# Patient Record
Sex: Female | Born: 1960 | Race: Black or African American | Hispanic: No | Smoking: Former smoker
Health system: Southern US, Community
[De-identification: ages and names within clinical notes are randomized; demographics above are authoritative.]

## PROBLEM LIST (undated history)

## (undated) DIAGNOSIS — N319 Neuromuscular dysfunction of bladder, unspecified: Secondary | ICD-10-CM

## (undated) DIAGNOSIS — R532 Functional quadriplegia: Secondary | ICD-10-CM

## (undated) DIAGNOSIS — F32A Depression, unspecified: Secondary | ICD-10-CM

## (undated) DIAGNOSIS — G35 Multiple sclerosis: Secondary | ICD-10-CM

## (undated) DIAGNOSIS — F329 Major depressive disorder, single episode, unspecified: Secondary | ICD-10-CM

## (undated) DIAGNOSIS — I2699 Other pulmonary embolism without acute cor pulmonale: Secondary | ICD-10-CM

## (undated) DIAGNOSIS — I82409 Acute embolism and thrombosis of unspecified deep veins of unspecified lower extremity: Secondary | ICD-10-CM

## (undated) HISTORY — PX: LAPAROSCOPIC TUBAL LIGATION: SUR803

## (undated) HISTORY — PX: TONSILLECTOMY: SUR1361

## (undated) SURGERY — Surgical Case
Anesthesia: *Unknown

---

## 2002-05-08 ENCOUNTER — Ambulatory Visit (HOSPITAL_COMMUNITY): Admission: RE | Admit: 2002-05-08 | Discharge: 2002-05-08 | Payer: Self-pay | Admitting: Internal Medicine

## 2002-11-06 ENCOUNTER — Encounter: Payer: Self-pay | Admitting: Emergency Medicine

## 2002-11-07 ENCOUNTER — Encounter: Payer: Self-pay | Admitting: Emergency Medicine

## 2002-11-07 ENCOUNTER — Inpatient Hospital Stay (HOSPITAL_COMMUNITY): Admission: EM | Admit: 2002-11-07 | Discharge: 2002-11-13 | Payer: Self-pay | Admitting: Emergency Medicine

## 2002-11-08 ENCOUNTER — Encounter: Payer: Self-pay | Admitting: General Surgery

## 2002-11-10 ENCOUNTER — Encounter: Payer: Self-pay | Admitting: General Surgery

## 2003-01-30 ENCOUNTER — Emergency Department (HOSPITAL_COMMUNITY): Admission: EM | Admit: 2003-01-30 | Discharge: 2003-01-31 | Payer: Self-pay | Admitting: Emergency Medicine

## 2003-01-31 ENCOUNTER — Encounter: Payer: Self-pay | Admitting: Emergency Medicine

## 2003-03-19 ENCOUNTER — Encounter: Payer: Self-pay | Admitting: Orthopedic Surgery

## 2003-03-20 ENCOUNTER — Ambulatory Visit (HOSPITAL_COMMUNITY): Admission: RE | Admit: 2003-03-20 | Discharge: 2003-03-20 | Payer: Self-pay | Admitting: Orthopedic Surgery

## 2003-07-09 ENCOUNTER — Ambulatory Visit (HOSPITAL_COMMUNITY): Admission: RE | Admit: 2003-07-09 | Discharge: 2003-07-09 | Payer: Self-pay | Admitting: Internal Medicine

## 2003-08-12 ENCOUNTER — Encounter: Admission: RE | Admit: 2003-08-12 | Discharge: 2003-08-12 | Payer: Self-pay | Admitting: Internal Medicine

## 2004-05-08 ENCOUNTER — Inpatient Hospital Stay (HOSPITAL_COMMUNITY): Admission: EM | Admit: 2004-05-08 | Discharge: 2004-05-13 | Payer: Self-pay | Admitting: Emergency Medicine

## 2004-09-01 ENCOUNTER — Encounter: Admission: RE | Admit: 2004-09-01 | Discharge: 2004-09-01 | Payer: Self-pay | Admitting: Internal Medicine

## 2005-09-05 ENCOUNTER — Encounter: Admission: RE | Admit: 2005-09-05 | Discharge: 2005-09-05 | Payer: Self-pay | Admitting: Internal Medicine

## 2006-10-25 ENCOUNTER — Encounter: Admission: RE | Admit: 2006-10-25 | Discharge: 2006-10-25 | Payer: Self-pay | Admitting: Internal Medicine

## 2007-04-03 ENCOUNTER — Encounter (HOSPITAL_BASED_OUTPATIENT_CLINIC_OR_DEPARTMENT_OTHER): Admission: RE | Admit: 2007-04-03 | Discharge: 2007-06-05 | Payer: Self-pay | Admitting: Surgery

## 2007-05-01 ENCOUNTER — Encounter: Admission: RE | Admit: 2007-05-01 | Discharge: 2007-05-01 | Payer: Self-pay | Admitting: Internal Medicine

## 2007-06-05 ENCOUNTER — Encounter (HOSPITAL_BASED_OUTPATIENT_CLINIC_OR_DEPARTMENT_OTHER): Admission: RE | Admit: 2007-06-05 | Discharge: 2007-09-03 | Payer: Self-pay | Admitting: Surgery

## 2007-09-18 ENCOUNTER — Encounter (HOSPITAL_BASED_OUTPATIENT_CLINIC_OR_DEPARTMENT_OTHER): Admission: RE | Admit: 2007-09-18 | Discharge: 2007-12-17 | Payer: Self-pay | Admitting: Surgery

## 2007-12-11 ENCOUNTER — Emergency Department: Payer: Self-pay | Admitting: Emergency Medicine

## 2008-02-07 ENCOUNTER — Ambulatory Visit: Payer: Self-pay | Admitting: Obstetrics and Gynecology

## 2008-04-07 ENCOUNTER — Emergency Department: Payer: Self-pay | Admitting: Emergency Medicine

## 2008-06-26 ENCOUNTER — Emergency Department: Payer: Self-pay | Admitting: Emergency Medicine

## 2008-08-12 ENCOUNTER — Ambulatory Visit: Payer: Self-pay | Admitting: Neurology

## 2008-11-09 ENCOUNTER — Ambulatory Visit: Payer: Self-pay

## 2009-01-02 ENCOUNTER — Ambulatory Visit: Payer: Self-pay | Admitting: Geriatric Medicine

## 2009-02-03 ENCOUNTER — Ambulatory Visit: Payer: Self-pay | Admitting: Internal Medicine

## 2009-02-16 ENCOUNTER — Ambulatory Visit: Payer: Self-pay | Admitting: Geriatric Medicine

## 2009-02-16 ENCOUNTER — Inpatient Hospital Stay: Payer: Self-pay | Admitting: Internal Medicine

## 2009-03-05 ENCOUNTER — Ambulatory Visit: Payer: Self-pay | Admitting: Internal Medicine

## 2009-04-07 ENCOUNTER — Ambulatory Visit: Payer: Self-pay | Admitting: Internal Medicine

## 2009-04-26 ENCOUNTER — Emergency Department: Payer: Self-pay | Admitting: Unknown Physician Specialty

## 2009-05-13 ENCOUNTER — Ambulatory Visit: Payer: Self-pay | Admitting: Geriatric Medicine

## 2009-05-13 ENCOUNTER — Emergency Department: Payer: Self-pay | Admitting: Internal Medicine

## 2009-06-25 ENCOUNTER — Emergency Department: Payer: Self-pay | Admitting: Emergency Medicine

## 2009-07-15 ENCOUNTER — Ambulatory Visit: Payer: Self-pay | Admitting: Gastroenterology

## 2009-11-12 ENCOUNTER — Ambulatory Visit: Payer: Self-pay | Admitting: Geriatric Medicine

## 2009-12-31 ENCOUNTER — Emergency Department: Payer: Self-pay | Admitting: Emergency Medicine

## 2010-01-21 ENCOUNTER — Emergency Department: Payer: Self-pay | Admitting: Unknown Physician Specialty

## 2010-06-05 DIAGNOSIS — I82409 Acute embolism and thrombosis of unspecified deep veins of unspecified lower extremity: Secondary | ICD-10-CM

## 2010-06-05 DIAGNOSIS — I2699 Other pulmonary embolism without acute cor pulmonale: Secondary | ICD-10-CM

## 2010-06-05 HISTORY — DX: Other pulmonary embolism without acute cor pulmonale: I26.99

## 2010-06-05 HISTORY — DX: Acute embolism and thrombosis of unspecified deep veins of unspecified lower extremity: I82.409

## 2010-06-29 IMAGING — XA IR VASCULAR PROCEDURE
2 series · 3 of 3 positions shown · IV contrast (IODINE)
Comparison: none

[Series 1: ivc · 2 of 2 slices shown (1 of 2)]
[im 1/2]
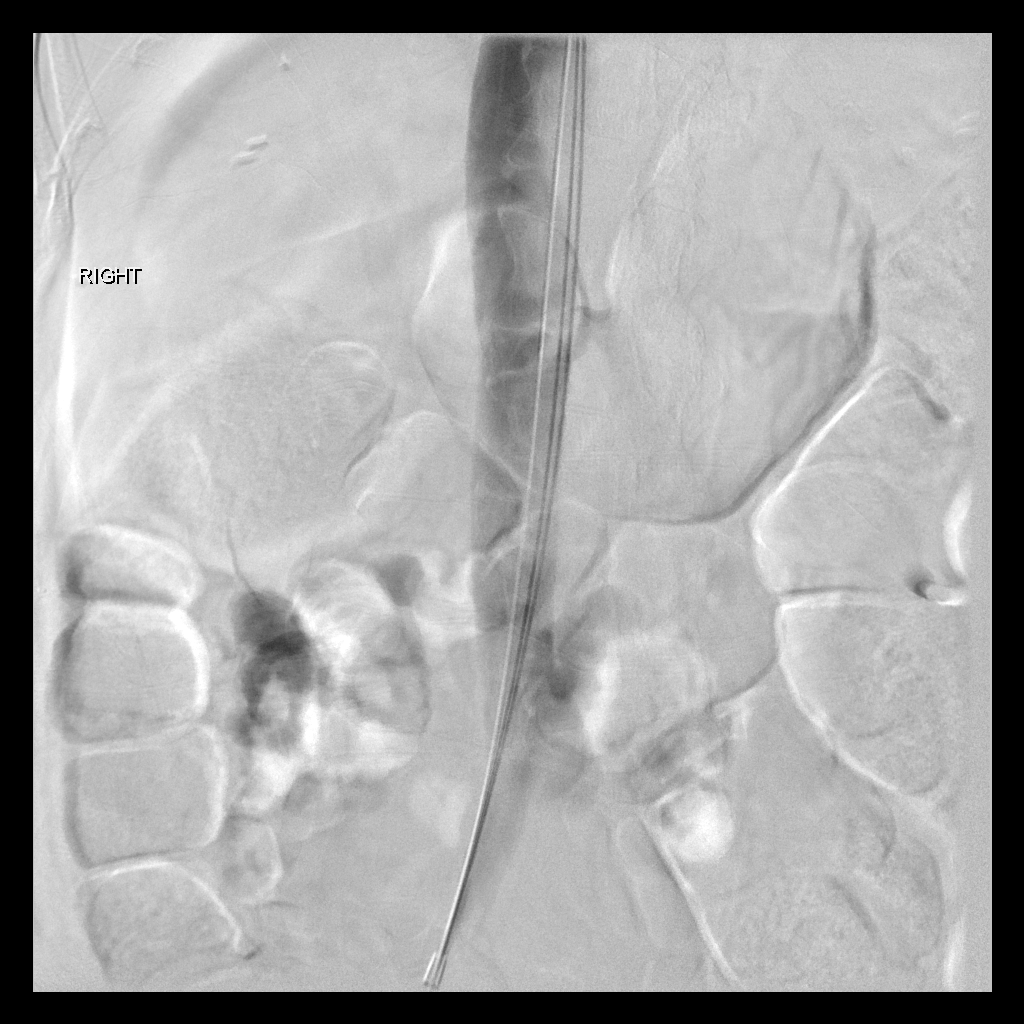
[im 2/2]
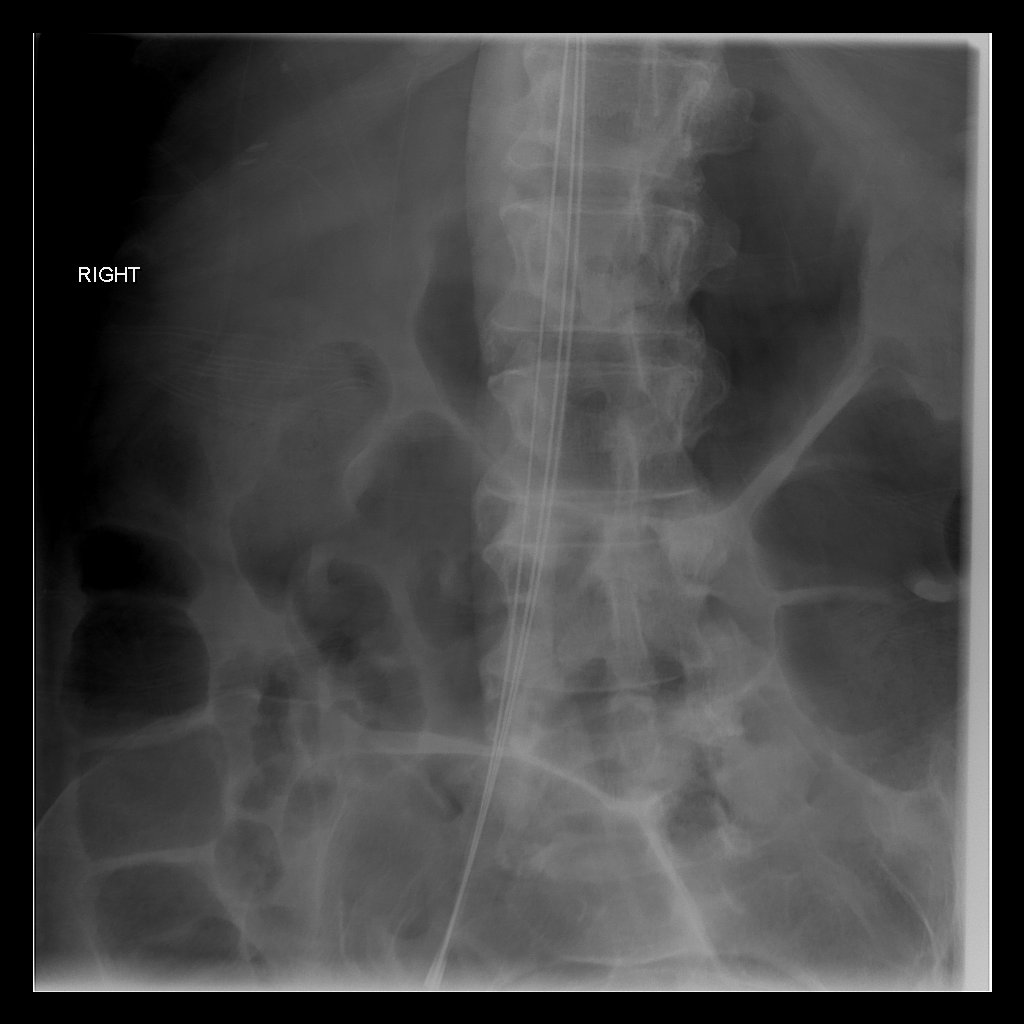

[Series 2: ivc · 1 of 1 slices shown (2 of 2)]
[im 1/1]
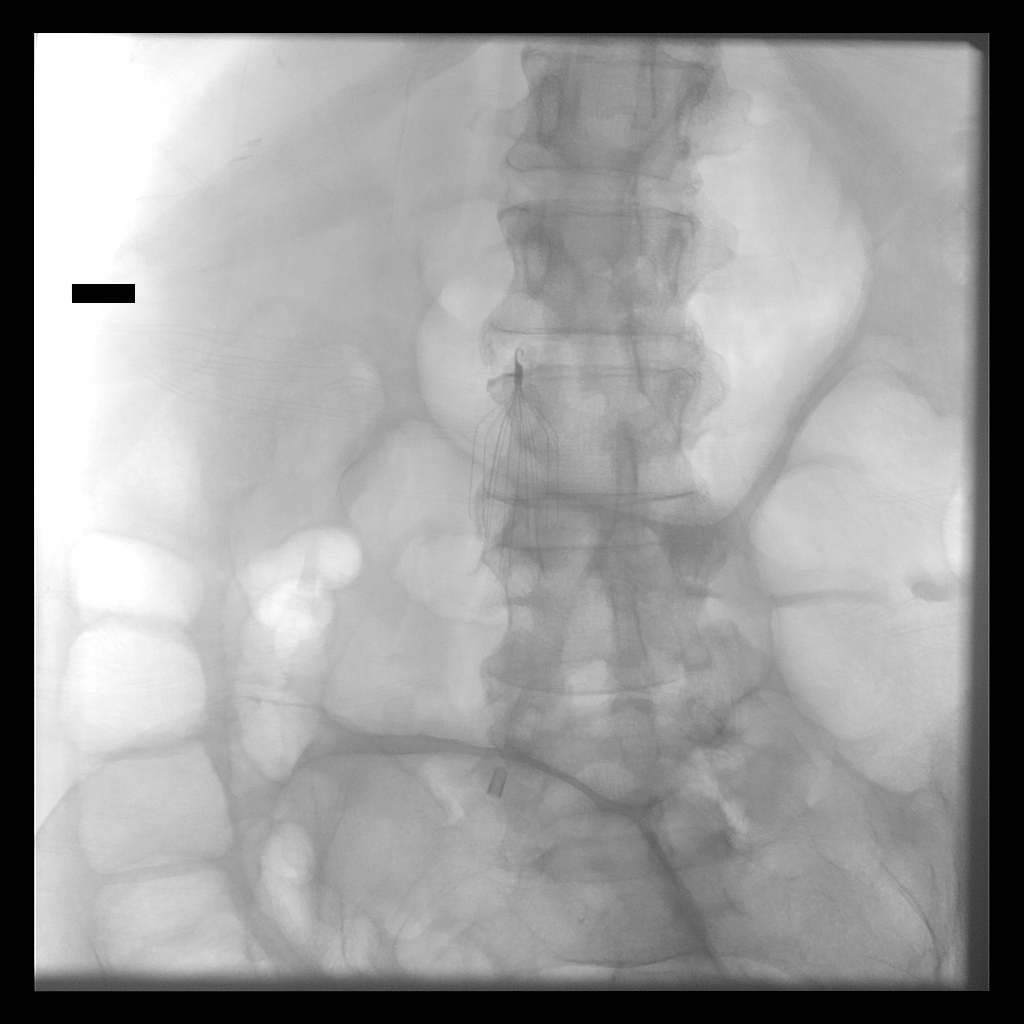

[3 of 3 positions shown; findings below may reference images not displayed]

IMAGES IMPORTED FROM THE [employer] WORKFLOW SYSTEM
NO DICTATION FOR STUDY

## 2010-07-02 IMAGING — US US RENAL KIDNEY
1 series · 17 of 25 positions shown · non-contrast
Comparison: none

REASON FOR EXAM: acute renal failure
COMMENTS:

[Series 1: us renal kidney · 17 of 37 slices shown]
[im 1/37]
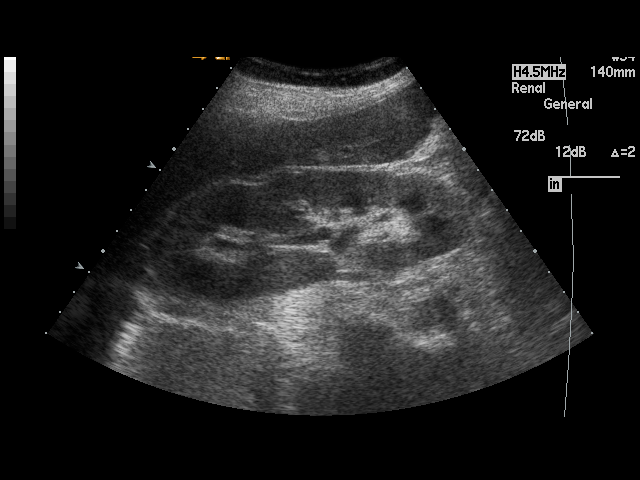
[im 4/37]
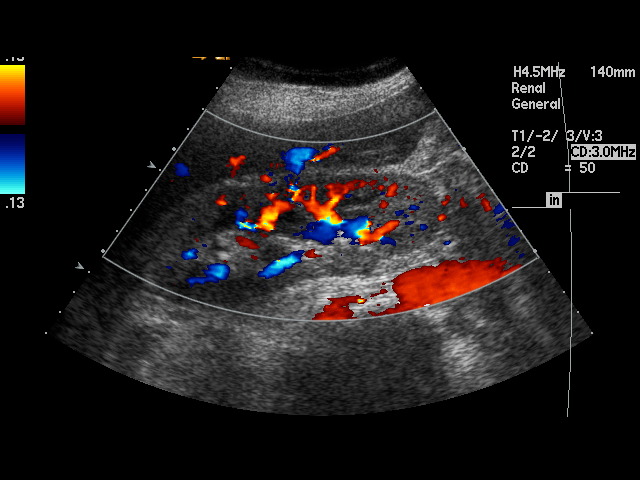
[im 5/37]
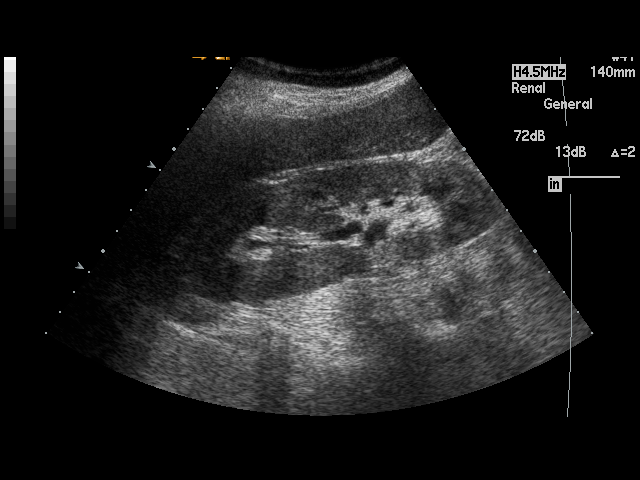
[im 8/37]
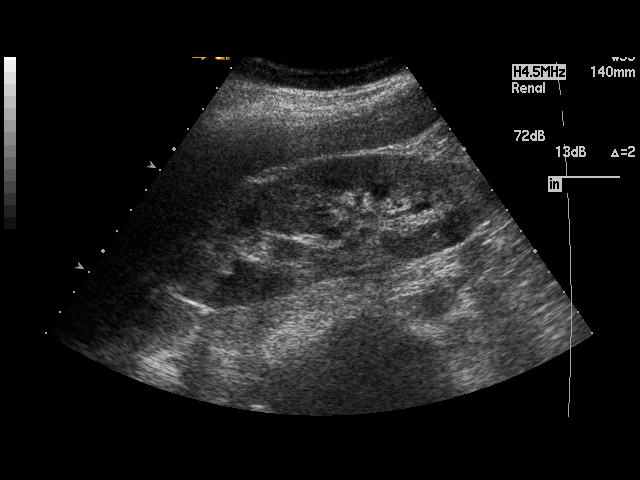
[im 10/37]
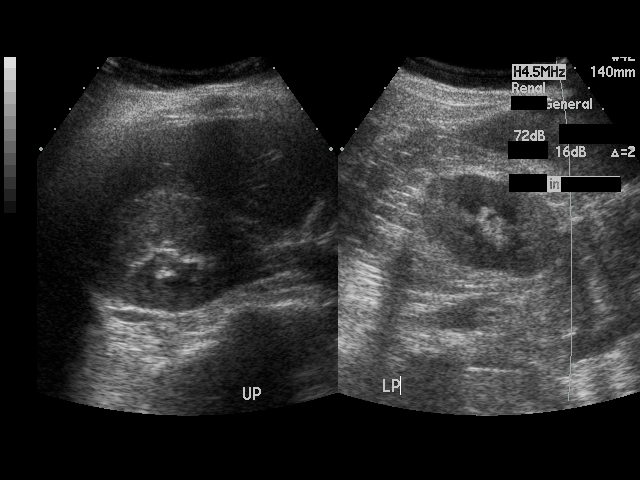
[im 13/37]
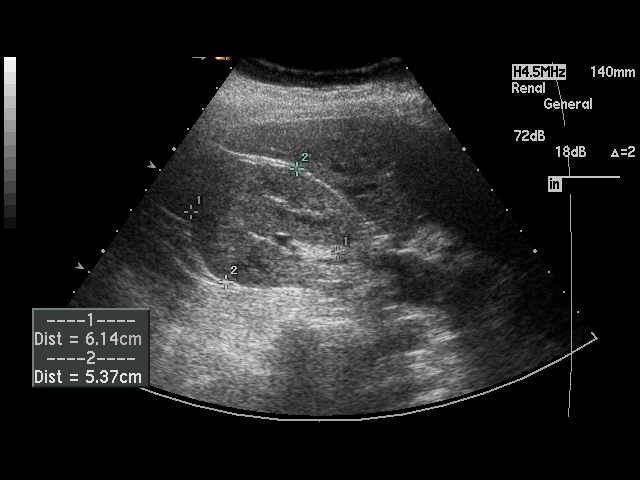
[im 14/37]
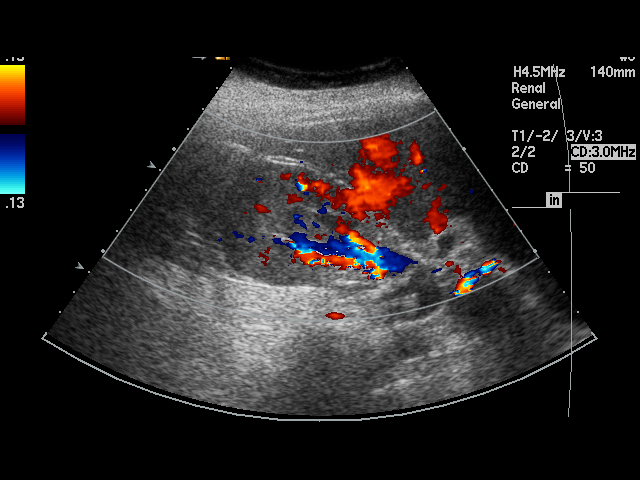
[im 17/37]
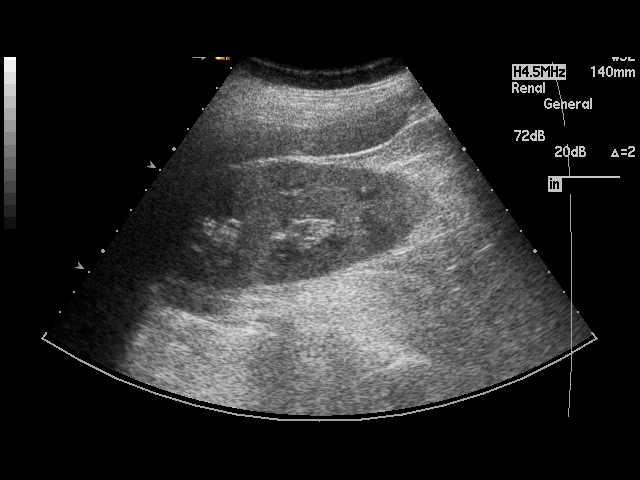
[im 19/37]
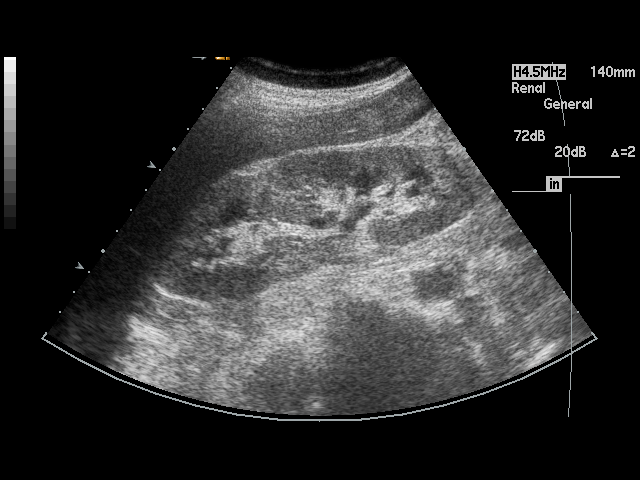
[im 20/37]
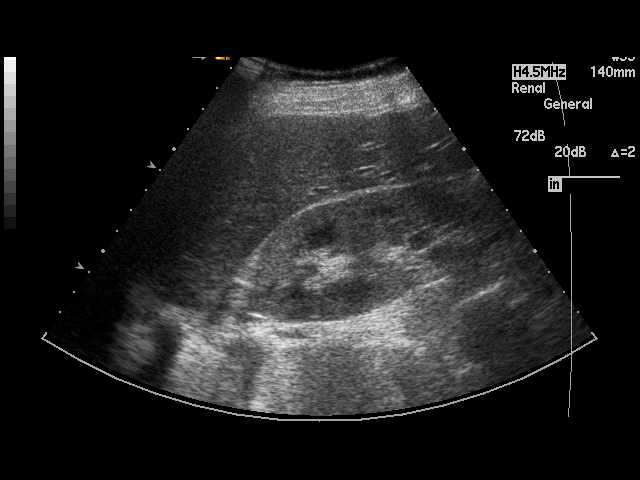
[im 23/37]
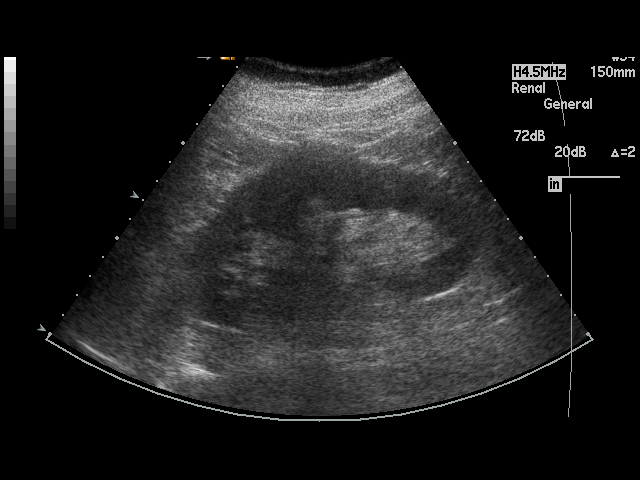
[im 25/37]
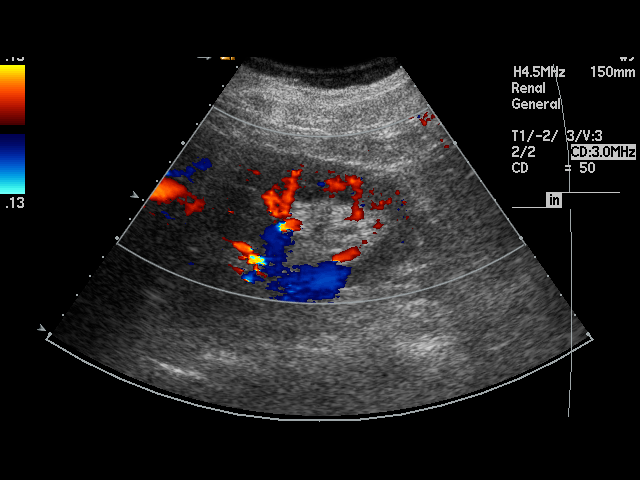
[im 28/37]
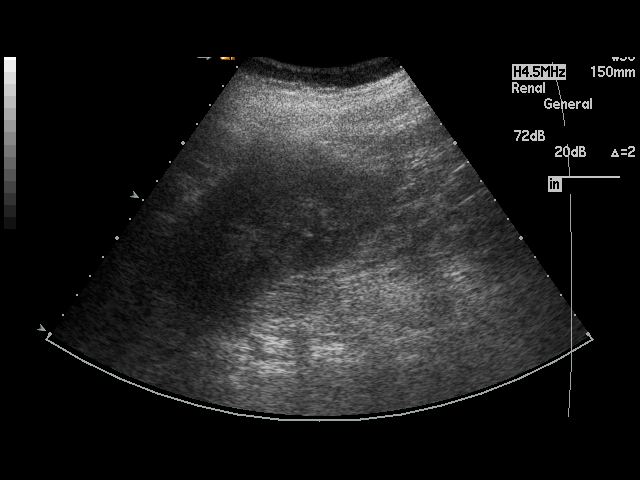
[im 29/37]
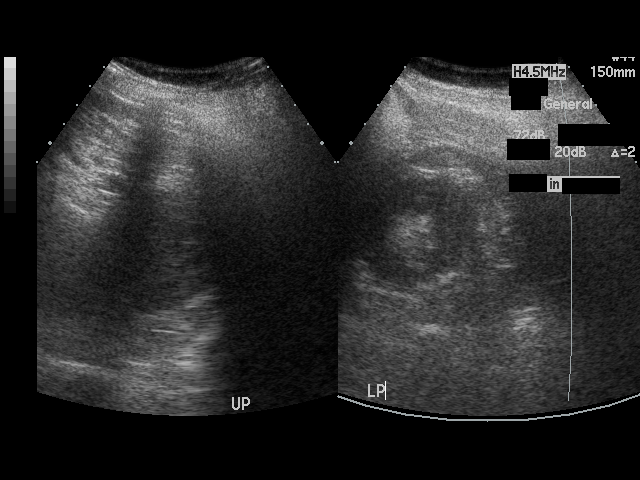
[im 32/37]
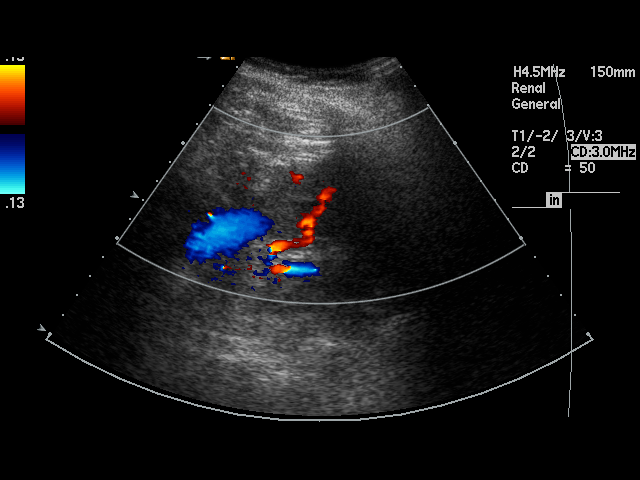
[im 34/37]
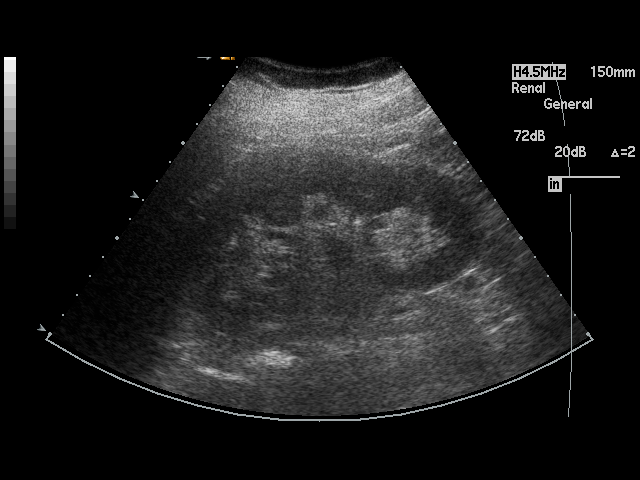
[im 37/37]
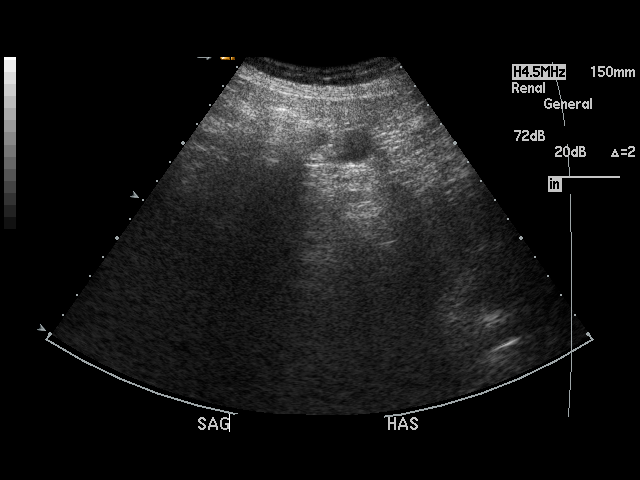

[17 of 25 positions shown; findings below may reference images not displayed]

PROCEDURE:     US  - US KIDNEY  - February 25, 2009  [DATE]

RESULT:     The right kidney measures 13.6 x 6.1 x 5.4 cm. The left kidney
measures 13 x 5.7 x 5.9 cm. The echotexture of the renal cortex on the right
is at least equal to that of the adjacent liver which likely reflects an
element of medical renal disease. I do not see evidence of obstruction. The
left kidney exhibits somewhat lower echotexture. There is no obstruction
either. The urinary bladder was decompressed with a Foley catheter.
IMPRESSION: 1. There is mildly increased echotexture the right kidney and likely of the
left kidney as well. It is difficult to directly compare the left kidney to
the liver due to positioning. The findings may reflect medical renal disease
however.
2. There is no evidence of obstruction.

## 2010-07-09 ENCOUNTER — Ambulatory Visit: Payer: Self-pay

## 2010-08-23 ENCOUNTER — Other Ambulatory Visit: Payer: Self-pay | Admitting: Geriatric Medicine

## 2010-09-20 ENCOUNTER — Ambulatory Visit: Payer: Self-pay | Admitting: Geriatric Medicine

## 2010-10-18 NOTE — Assessment & Plan Note (Signed)
[facility]   NAME:  [facility], [facility]                ACCOUNT NO.:  [id number]   MEDICAL RECORD NO.:  [id number]      DATE OF BIRTH:  1961-02-28   PHYSICIAN:  Theresia Majors. Tanda Rockers, M.D. VISIT DATE:  06/11/2007                                   OFFICE VISIT   SUBJECTIVE:  The patient is a 50 year old female who we are following  for a stage IV sacral ischial ulcer.  The patient has been on a wound  V.A.C. per [facility] protocol in the interim.  There has been no fever.  The  patient continues on Septra DS 1 p.o. b.i.d.  She is accompanied by her  mother and a case worker and is being transported by [employer], [employer].   OBJECTIVE:  VITAL SIGNS:  Blood pressure is 106/77, respirations 18,  pulse rate 93, temperature is 98.4  SKIN:  Inspection of the right ischium shows that the wound is clean.  There is healthy-appearing granulation.  The wound was sounded with a Q-  tip and discloses a depth of approximately 3 cm.  This is significantly  decreased from her previous exam.  There is no necrosis within the wound  and no debridement is needed   ASSESSMENT:  Clinical improvement with wound V.A.C. therapy.   PLAN:  We are reordering the continuation of both the Septra DS 1 p.o.  b.i.d.  We have requested that the facility repeat her wound culture, as  well as a urine culture.  We will re-evaluate her in 2 weeks.  We have  given the patient and her mother an opportunity to ask questions, and  they seem to understand the clinical impression of the recommendations  and indicate that they will be compliant.      Harold A. Tanda Rockers, M.D.  Electronically Signed     HAN/MEDQ  D:  06/12/2007  T:  06/12/2007  Job:  [id number]

## 2010-10-18 NOTE — Assessment & Plan Note (Signed)
[facility]   NAME:  [name], [name]                ACCOUNT NO.:  [id number]   MEDICAL RECORD NO.:  [id number]      DATE OF BIRTH:  1961/02/16   PHYSICIAN:  Theresia Majors. Tanda Rockers, M.D. VISIT DATE:  09/19/2007                                   OFFICE VISIT   SUBJECTIVE:  Br[facility] is a 50 year old female who we have followed  for a stage IV ischial ulcer involving the right lower extremity.  In  the interim, she has been treated with a wound VAC.  She remains a  resident at [facility].  There has been no interim change in her  medication.  The patient feels that the wound has improved.  There has  been less drainage, and there has been significantly decreased pain.  She is accompanied by her mother and an EMS transporter.   OBJECTIVE:  VITALS:  Blood pressure is 113/73, respirations 18, pulse  rate 71, temperature 98.5.   The patient was examined from the left lateral decubitus position.  The  right ischial ulcer is clean in appearance.  There is no palpable  fluctuance or expressible drainage.  Upon sounding with a Q-tip, this  wound extends to 2.5 cm and abuts onto the ischium.  There is a serous  drainage which has a mild malodor.  There is minimum undermining at 6  o'clock.  The wound #2 on the right buttock has completely resolved.   ASSESSMENT:  Clinical improvement with the negative pressure drainage  system.   PLAN:  We will continue the wound VAC per [facility] protocol at a negative 125  mmHg.  We will reevaluate the patient in 1 month.   We have given the patient and the mother an opportunity to ask  questions.  They expressed gratitude for having been seen in the clinic.      Harold A. Tanda Rockers, M.D.  Electronically Signed     HAN/MEDQ  D:  09/19/2007  T:  09/20/2007  Job:  [id number]

## 2010-10-18 NOTE — Assessment & Plan Note (Signed)
[facility]   NAME:  [facility], Brittany [facility]en                ACCOUNT NO.:  [id number]   MEDICAL RECORD NO.:  [id number]      DATE OF BIRTH:  06/04/1961   PHYSICIAN:  Jonelle Sports. Sevier, M.D.  VISIT DATE:  05/28/2007                                   OFFICE VISIT   HISTORY:  This 50 year old black female with severe multiple sclerosis  is followed for a deep ischial tuberosity decubitus on the right side.  She initially had been begun on vac therapy but this was discontinued  for reasons that are less than clear.  There has been from time to time  a bit of necrotic tissue within the wound, but this has been largely  debrided out over time.   The patient has had no new problems since her last visit here and the  wound has been managed simply with daily packing with mild Dakin's  soaked gauze.  She has also been begun on Septra DS in dose that is  uncertain to me, I presume at least b.i.d. to t.i.d.   There has been no fever, chills or systemic symptomatology.  The  patient's mother thinks the wound may be slightly larger.   EXAMINATION:  Blood pressure 101/72, pulse 89, respirations 16,  temperature 97.1.  the wound itself is largely clean and free of odor  with minimal necrotic tissue in the distal floor the wound.  This does  not extend extremely deeply.   The wound measurement is recorded here as 5.5 x 4.5 x 6.0 cm with 4 cm  undermining from approximately 6 o'clock to 1 o'clock.   IMPRESSION:  Stable stage IV decubitus right ischial area.   DISPOSITION:  It seems to me that this wound can most successfully be  managed by a vac therapy and accordingly we will attempt to reinstitute  that, the reasons for his discontinuation been less than clear in my  mind.   I have today debrided away a small amount of the remaining necrotic  tissue in the distal base of the wound.  This was well tolerated and the  small amount of bleeding induced was stabilized through pressure.   The  wound was then packed here with iodoform gauze and the patient is to  return to her receiving facility with instructions to contact [facility] for  reinstitution of vac therapy, for continuation for two additional weeks  of the Septra DS and for daily wound dressing with Dakin's gauze as  before until such time as the vac can be established.   Follow-up visit will be here in 2 weeks.           ______________________________  Jonelle Sports Cheryll Cockayne, M.D.     RES/MEDQ  D:  05/28/2007  T:  05/28/2007  Job:  [id number]

## 2010-10-18 NOTE — Assessment & Plan Note (Signed)
[facility]   NAME:  SAMON, DISHNER                ACCOUNT NO.:  [id number]   MEDICAL RECORD NO.:  [id number]      DATE OF BIRTH:  June 08, 1960   PHYSICIAN:  Theresia Majors. Tanda Rockers, M.D. VISIT DATE:  05/14/2007                                   OFFICE VISIT   SUBJECTIVE:  Ms. Chovan is a 50 year old lady who is seen in follow-up  for a stage IV ischial ulceration.  In the interim, she has been managed  at the nursing facility with sequential moist dressings utilizing  quarter percent Dakin's solution.  The patient reports that she feels  that her wound is better.  There is less drainage.  There has been no  interim fever.   OBJECTIVE:  Blood pressure is 113/75, respirations 14, pulse rate of 83,  temperature is 98.2.  Inspection of the wound shows that there is a  residual of necrotic tissue which was sharply excised.  The full-  thickness excisional debridement was accomplished without anesthesia and  tolerated very well by the patient.  Frankly necrotic tissue was excised  extending through the skin, subcutaneous area and fascia.  A deep wound  culture was then taken.  The wound was initially packed with plain gauze  for pressure to control minimum hemorrhage.  Thereafter, the gauze was  removed with no hemorrhage noted and a moist Kerlix soap and a 0.25%  Dakin's solution was applied.   ASSESSMENT:  Improved stage IV right ischial ulceration.   PLAN:  We will continue the Dakin's solution dressing.  The patient will  be seen in 2 weeks at the wound center.  Routine nursing care will be  continued at the nursing facility.  The patient was accompanied by her  mother who indicates that she has discussed her previously noted  grievances with the staff at the facility.      Harold A. Tanda Rockers, M.D.  Electronically Signed     HAN/MEDQ  D:  05/14/2007  T:  05/14/2007  Job:  [id number]   cc:   Ludwig Clarks, M.D.

## 2010-10-18 NOTE — Assessment & Plan Note (Signed)
[facility]   NAME:  [facility], MCCUMBERS                ACCOUNT NO.:  [id number]   MEDICAL RECORD NO.:  [id number]      DATE OF BIRTH:  14-Dec-1960   PHYSICIAN:  Theresia Majors. Tanda Rockers, M.D. VISIT DATE:  05/06/2007                                   OFFICE VISIT   SUBJECTIVE:  Ms. Jamison is a  50 year old female who is a resident of  [facility]. She has been treated for a stage III decubitus  involving the right ischium.  She is under the care of Dr. Ludwig Clarks.  The  wound care nurse at the facility requested that we see her for  progressive/worsening of the wound.  According to the patient, the wound  VAC was discontinued a week ago and there has been increasing malodor.  There has been no fever.   The patient is accompanied by her mother who he is dissatisfied with the  care that she is receiving.  Her mother is also complaining of the  absence of an attending nurse during this visit.   OBJECTIVE:  Blood pressure is 96/70, respirations 18, pulse rates 83,  temperature is 98.5.  The patient is accompanied by her mother and the  attendant from [place]. In the left lateral decubitus position,  the right ischium was inspected.  This wound has a halo of full-  thickness necrosis with ecchymosis and frank liquefaction.  An  anesthetic mixture was placed into the wound and thereafter an  excisional debridement was performed. Full necrosed skin, subcutaneous  tissue and adipose were excised.  There was healthy bleeding tissue  which was easily controlled with direct pressure.  The wound depths were  measured, cataloged and the wound was photographed.  The wound was  cultured.   ASSESSMENT:  This wound has progressed from a stage III to a stage IV  right ischial wound extending onto the periosteum of the ischium.   PLAN:  We have recommended that we discontinue the use of the wound VAC  and that we institute daily quarter percent Dakin's solution irrigations  and  packings.  We will reevaluate this patient in 1 week.  In the  interim, all standard efforts at decubitus prevention should be utilized  including attention to frequent turnings, the use of an air mattress  with an oscillating cycle.  Attention to nutrition and surveillance for  the possibility of a deeper infection is also recommended.   We have explained this recommendation to the patient and her mother in  terms that they seem to understand. We have encouraged the mother to  address her grievances with the facility director.  We will reevaluate  the patient in 1 week.      Harold A. Tanda Rockers, M.D.  Electronically Signed     HAN/MEDQ  D:  05/06/2007  T:  05/06/2007  Job:  [id number]   cc:   Henri Medal, MD

## 2010-10-18 NOTE — Assessment & Plan Note (Signed)
[facility]   NAME:  [facility], [facility]en                ACCOUNT NO.:  [id number]   MEDICAL RECORD NO.:  [id number]      DATE OF BIRTH:  01-Feb-1961   PHYSICIAN:  Theresia Majors. Tanda Rockers, M.D.      VISIT DATE:                                   OFFICE VISIT   SUBJECTIVE:  Brittany Madden is a 50 year old lady who we are following for  stage IV left ischial ulcer.  In the interim she has been treated at  [facility] with a wound vac.  There has been no interim fever.  The malodor has been limited.  An interim culture result has not been  forwarded.   OBJECTIVE:  Blood pressure is 96/67, respirations 18, pulse rate 82,  temperature 97. The patient is accompanied by the EMS attendants.  The  wound is clean with evidence of significant volume reduction, no  debridement was needed.  The periwound area is clean.  There is no  evidence of additional maceration or pressure ulceration.  Please refer  to the data entries for specifics.   ASSESSMENT:  Clinical improvement, positive response with negative  pressure dressing.   PLAN:  Will continue the wound vac. In the interim we will place the  patient in a moist moist dressing.  We will reevaluate the patient in 1  month.      Harold A. Tanda Rockers, M.D.  Electronically Signed     HAN/MEDQ  D:  06/27/2007  T:  06/27/2007  Job:  [id number]

## 2010-10-18 NOTE — Assessment & Plan Note (Signed)
[facility]   NAME:  [facility], FRETWELL                ACCOUNT NO.:  [id number]   MEDICAL RECORD NO.:  [id number]      DATE OF BIRTH:  [birth year]   PHYSICIAN:  Theresia Majors. Tanda Rockers, M.D. VISIT DATE:  04/04/2007                                   OFFICE VISIT   SUBJECTIVE:  Ms. [facility] is a 50 year old female referred by Dr. Ludwig Clarks  for evaluation of a pressure ulcer on the right buttock area.   IMPRESSION:  Stage 3 decubitus.   RECOMMENDATIONS:  Proceed with a wound vac per [facility] protocol with serial  exams at a q. month interval in the wound center.  The patient will  likely require interim debridements to effect complete closure.   SUBJECTIVE:  Ms. [facility] is a 50 year old lady who has been a resident  of the [facility] facility for the past several years.  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  In  addition, she has had a major cerebrovascular accident which has further  compromised her mobility.   The current wound has been present for approximately 2 months. It has  been associated with malodor and breakdown.  It has been treated with  local pressure reducing methods as well as topical agents.  There has  been no interim fever. She has had antibiotics delivered based on  cultures.  Most recently she completed a 5-day course of Keflex.   ALLERGIES:  She is allergic to ASPIRIN, NSAIDs and seafood.   CURRENT MEDICATION:  1. Claritin 10 mg daily.  2. Singulair 10 mg daily.  3. Neurontin 600 mg b.i.d.  4. Betaserone 0.3 mg subcutaneously every other day.  5. Mirapex 0.125 mg b.i.d.  6. Lopressor 25 mg b.i.d.  7. Zelnorm 6 mg b.i.d.  8. Xanax 150 mg daily.  9. Crestor 5 mg daily.  10.Vicodin 5/500 p.r.n.  11.Depakote 250 mg daily.  12.Cymbalta 60 mg daily.   PAST SURGICAL HISTORY:  Tubal ligation, tracheostomy, gastrostomy and  contracture releases.  She is disabled due to her multiple sclerosis.   FAMILY HISTORY:   Negative for multiple sclerosis. Her mother lives in  [place]. She has several siblings who are in good health.  The family  history is positive for hypertension, stroke and heart attack. It is  also positive for diabetes.   REVIEW OF SYSTEMS:  Remarkable for her invalid status.  She requires  complete nursing care.   PHYSICAL EXAM:  She is a near paraplegic female in no acute distress.  She has a muffled voice but there is no respiratory distress or  difficulty with handling secretions.  There is no voluntary movement of  her lower extremities.  There is gross motion of both upper extremities.  Facial muscles are weak but the patient is able to make a partial smile.  Blood pressure is 130/70, respirations 18, pulse rate 80, temperature is  98.1.  HEENT:  Clear.  NECK:  Supple.  Trachea is midline.  Thyroid is nonpalpable.  Breath sounds are shallow.  Heart sounds are distant.  The abdomen is protuberant with tympany but no peritoneal signs, active  bowel sounds.  The extremity exams are remarkable for relative anesthesia with released  contractures. There are no  ulcerations.  Inspection of the lumbar sacral  area discloses an ulceration at the inferior ischial area in the buttock  crease on the right. This wound extends 1.5 to 2 cm in depth.  There is  no excessive necrosis, there is no excessive malodor,  a debridement is  not needed. Neurologically, the patient has anesthesia throughout the  area of the wound.   DISCUSSION:  Ms. [facility] has a stage 3 ischial ulcer which should respond  nicely to negative pressure vacuum therapy. We will recommend that we  proceed with the [facility] appliance per protocol recommending 125 mm suction  continuously with changes every 2-3 times a week as determined by the  output.  The patient should be able to bathe between the changes of the  vac to improve her hygiene.  We have discussed this recommendation with  the patient in terms that she seems  to understand.  She expresses  gratitude for having been seen in the clinic.  We will reevaluate her in  1 month p.r.n.      Harold A. Tanda Rockers, M.D.  Electronically Signed     HAN/MEDQ  D:  04/04/2007  T:  04/05/2007  Job:  [id number]   cc:   Ralene Ok, M.D.

## 2010-10-18 NOTE — Assessment & Plan Note (Signed)
[facility]   NAME:  [facility], [facility]                ACCOUNT NO.:  [id number]   MEDICAL RECORD NO.:  [id number]      DATE OF BIRTH:  1961-01-08   PHYSICIAN:  Theresia Majors. Tanda Rockers, M.D. VISIT DATE:  08/22/2007                                   OFFICE VISIT   SUBJECTIVE:  Br[facility] is a 50 year old lady who we are following for a  right stage IV pressure ulcer on the ischium.  She continues to have a  wound vac applied.  She continues to be rotated from her dependent  position in the bed into a chair daily.  Her appetite is good.  There  has been no interim fever or excessive drainage.  She is accompanied by  her mother.   OBJECTIVE:  VITAL SIGNS:  Blood pressure is 104/67, respirations 16,  pulse rate 69, temperature 97.9.  Inspection of the right ischial area  shows that the external orifice is approximately 1 cm with a sinus  extending 8 cm.  The wound is clean.  There is no evidence of periwound  erythema, fluctuance or cellulitis.  There are areas of mild maceration  at the 6 o'clock position with a small area of epithelial evulsion.   ASSESSMENT:  Clinical improvement of stage IV pressure ulcer.   PLAN:  We will continue the wound vac.  We are recommending that a  sponge be placed into the sinus and that the suction be reduced to  negative 100 mmHg.  We will reevaluate the patient in 1 month.      Harold A. Tanda Rockers, M.D.  Electronically Signed     HAN/MEDQ  D:  08/22/2007  T:  08/22/2007  Job:  [id number]

## 2010-10-18 NOTE — Assessment & Plan Note (Signed)
[facility]   NAME:  [facility], [facility]                ACCOUNT NO.:  [id number]   MEDICAL RECORD NO.:  [id number]      DATE OF BIRTH:  05-01-1961   PHYSICIAN:  Theresia Majors. Tanda [facility]en, M.D.      VISIT DATE:                                   OFFICE VISIT   SUBJECTIVE:  Br[facility]any [facility] is a 50 year old female who we followed for  stage IV pressure ulcer.  She is currently a resident at [facility] under the care of Dr. Bernarda [facility].  She has been treated with a  wound VAC for the last month.  There has been no fever.  Appetite  remains good.  She continues to use an active air mattress bed.  She is  accompanied by her mother.  Vital signs are not indicated the patient  was examined from the left lateral decubitus position.  Wound #1 shows  dramatic contraction.  There is the new development of areas of  maceration and desquamation.  The wound itself has a 100% granulation  with advancing epithelium from the periphery.  There is no evidence of  abscess formation or tracking.  There was fresh blood on the pamper, and  the patient was examined more thoroughly by the nursing staff and a  determination that her blood was coming from her vaginal vault.   ASSESSMENT:  Improvement of stage IV pressure ulcer.   PLAN:  We will discontinue the wound VAC.  We have written orders for  daily antibacterial soap wash and thorough drying with routine perineal  care per the [facility].  We will re-evaluate the patient  in 1 month p.r.n.  We have given the patient and the mother an  opportunity to ask questions.  They seem to understand the clinical  impression, recommendation, and indicate that they would be compliant.      [facility], M.D.  Electronically Signed     HAN/MEDQ  D:  10/24/2007  T:  10/25/2007  Job:  [id number]

## 2010-10-18 NOTE — Assessment & Plan Note (Signed)
[facility]   NAME:  [facility]                ACCOUNT NO.:  [id number]   MEDICAL RECORD NO.:  [id number]      DATE OF BIRTH:  05-08-1961   PHYSICIAN:  Theresia Majors. Tanda Rockers, M.D. VISIT DATE:  07/25/2007                                   OFFICE VISIT   SUBJECTIVE:  Br[facility]any [facility] is a 50 year old female whom we have followed  for a stage IV pressure ulcer involving the right ischium.  In the  interim, she has been treated with a wound vac.  She has also been on an  active air mattress to avoid extreme pressure.  She is seen in follow-  up.  There has been no interim fever but there has been particularly  strong malodor.  She is accompanied by EMS and her mother.   Blood pressure is 95/68, respirations 16, pulse rate 79, temperature is  97.6.  Inspection of the right ischial area including the ulcer shows that  there is a granulating flat area with a central area of ulceration that  extends 8 cm down to periosteum.  This is a sinus tract that easily  accommodates the Q-tip, but is not associated with a larger cavity based  upon the sounding.  There is no periwound induration or fluctuance.  The  malodor is suggestive of mixed anaerobic flora.   ASSESSMENT:  Clinically improved pressure ulcer, responding to the wound  vac.   PLAN:  We are recommending that the nursing home proceed with a culture  of the wound and institute specific antibiotics based on those culture  reports.  We will continue the wound vac.  We will reevaluate the  patient in 1 month.  If there is no significant decrease in the sinus,  this patient would be advised to have an orthopedic consultation for  possible operative curettage and debridement of all nonviable tissue,  which may be a deep to the access provided by the wound vac.   We have explained the clinical impression and these recommendations to  the patient and her mother in terms that they seem to understand.  They  express gratitude  for having been seen in the clinic and indicate that  they will be compliant. a copy of this note to [place]  attention Dr. more area.      Harold A. Tanda Rockers, M.D.  Electronically Signed     HAN/MEDQ  D:  07/25/2007  T:  07/25/2007  Job:  [id number]   cc:   Ralene Ok, M.D.

## 2010-10-18 NOTE — Assessment & Plan Note (Signed)
[facility]   NAME:  Brittany Madden, Brittany Madden                ACCOUNT NO.:  [id number]   MEDICAL RECORD NO.:  [id number]      DATE OF BIRTH:  [birth year]   PHYSICIAN:  Theresia Majors. Tanda Rockers, M.D. VISIT DATE:  05/20/2007                                   OFFICE VISIT   This is a phone conversation with Dr. Ludwig Clarks.  We discussed this  morning [name]' culture reports showing a Providencia stuartii.  The  sensitivity suggests that this organism is sensitive to sulfur.  Dr.  Ludwig Clarks and I discussed the options of an intravenous antibiotic versus  an oral sulfur drug.  We concurred that the patient would be started on  Septra DS if she is in fact not allergic to sulfur.  We will continue to  follow her clinically in the wound center.  Review of the intake history  and physical suggests that THE PATIENT IS ALLERGIC ONLY TO ASPIRIN,  NSAIDS AND SEAFOOD.  The order for initiation of antibiotics has been  forwarded to [facility], i.e., senior care.  The patient will keep  the [previously arranged appointment at the wound center.      Harold A. Tanda Rockers, M.D.  Electronically Signed     HAN/MEDQ  D:  05/20/2007  T:  05/20/2007  Job:  [id number]

## 2010-10-21 NOTE — H&P (Signed)
Brittany, Madden                            ACCOUNT NO.:  [id number]   MEDICAL RECORD NO.:  [id number]                   PATIENT TYPE:  INP   LOCATION:  5735                                 FACILITY:  [facility]   PHYSICIAN:  Adolph Pollack, M.D.            DATE OF BIRTH:  Jun 19, 1960   DATE OF ADMISSION:  11/06/2002  DATE OF DISCHARGE:                                HISTORY & PHYSICAL   CHIEF COMPLAINT:  Right-sided abdominal pain.   HISTORY OF PRESENT ILLNESS:  The patient is a 50 year old female  who on  Monday had the onset of nausea and vomiting. Following  the nausea and  vomiting she then developed a sharp right-sided abdominal pain. The people  at [place] it may be related to constipation and gave her a  Dulcolax suppository and she had a bowel movement but still did not have  relief. She continued to have some of the pain on Tuesday and Wednesday and  a heating pad was applied. She had been voiding normally. The pain again is  fairly constant and is sharp and is in the right flank, right mid abdomen  and right lower quadrant abdominal  area.   She eventually presented to the emergency department because of the  persistence of the pain. She has had a good appetite since the episode of  nausea and vomiting and has been eating. She denies any fevers or chills, no  diarrhea. She has had some impaction before but denies severe pain like  this. She has been in the emergency department since approximately 6:30 on  November 06, 2002, and I was subsequently called and asked to see her at  approximately 4 a.m. because of the undefined abdominal pain. A CT scan was  performed in the emergency department as well as some plain films. This was  consistent with a large amount of stool in the colon. There were no acute  inflammatory changes. The appendix was noted to be elongated with a 5-mm  fecalith in the midportion, but there is no appendiceal thickening or  inflammatory changes  suggestive of acute appendicitis. There is a small left  ovarian type cyst and a tiny amount of free fluid in the cul-de-sac.   PAST MEDICAL HISTORY:  1. Multiple sclerosis, treated by Dr. Oralia Manis.  2. 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  3. Acute renal failure related to #2. All of this was treated at [facility] and occurred on November 16, 2002.  4. History of cocaine overdose.  5. Chronic renal insufficiency   PAST SURGICAL HISTORY:  1. Lower extremity surgery.  2. Bilateral tubal ligation.  3. Tracheostomy.  4. Gastrostomy.   ALLERGIES:  ASPIRIN, NSAIDS AND SEAFOOD.   MEDICATIONS:  She takes Betaseron and other antihypertensive. Other  medications include:  1.  Lovenox 40 mg subcu daily.  2. Betaseron 250 micrograms subcu every other day.  3. Depakote 250 mg daily.  4. Lexapro 20 mg daily.  5. Mirapex 25 mg b.i.d.  6. Lopressor 75  mg b.i.d.  7. Senokot 1 q. h.s.  8. Ambien 10 mg q. h.s.  9. Colace 100 mg q. h.s.  10.      Zantac 150 mg daily.  11.      Seroquel 25 mg q. h.s.  12.      Vicodin p.r.n. pain.   SOCIAL HISTORY:  She is a former smoker. She denies alcohol use.   FAMILY HISTORY:  Positive for sickle cell disease and hypertension.   REVIEW OF SYSTEMS:  CONSTITUTIONAL:  Other than abdominal pain, she has been  in her normal state of health. CARDIOVASCULAR:  As per HPI. No further heart  problems reported since the cocaine induced myocardial infarction.  PULMONARY:  Denies pneumonia, asthma, COPD or TB. GI:  Denies peptic ulcer  disease, hepatitis, diverticulitis. GU:  No known kidney stones. She has had  a urinary tract infection in the past. ENDOCRINE: No thyroid disease or  diabetes. NEUROLOGIC:  No reported seizures. HEMATOLOGIC:  No bleeding  disorders, transfusions or blood clots. MUSCULOSKELETAL:  She is now a non  ambulator and goes to physical  therapy at [facility].   PHYSICAL EXAMINATION:  GENERAL:  A well developed, well nourished female. In  no acute distress and actually is very pleasant and cooperative.  VITAL SIGNS:  Her last temperature was 99.1, blood pressure 110/70, pulse  74, oxygen saturations 98% on room air.  HEENT:  Extraocular movements intact with no icterus noted.  NECK:  Supple without masses. No obvious thyroid enlargement. There is a  lower midline neck scar.  RESPIRATORY:  Breath sounds equal and clear. Respirations are unlabored.  CARDIOVASCULAR:  Heart demonstrates a regular rate and rhythm.  ABDOMEN:  Slightly firm, protuberant. Active bowel sounds are noted.  She  has some mild left lower quadrant tenderness to palpation. The right side of  her abdominal wall and flank are hyperesthetic to even the touch with the  sheet in a dermatomal type pattern, although no rash is noted. She does have  abdominal  tenderness to palpation and percussion in the right lower  quadrant and right flank areas. There are no obvious masses noted.  PELVIC:  There is very mild right adnexal tenderness but no obvious mass. No  left adnexal tenderness.  RECTAL:  She has normal tone. There some mucoid type stool in the rectal  vault and it was sent for Hemoccult testing.  No palpable mass is noted. No  significant tenderness on examination.  EXTREMITIES:  Decreased range of motion in the lower extremities. Good range  of motion in the upper extremities. No cyanosis or edema noted.  NEUROLOGIC:  Slightly slurred speech but is easily understood. Decreased  motor strength in the lower extremities.   LABORATORY DATA:  White cell count has been done twice, first at 8:45 p.m.  when it was 7200, no leftward shift and hemoglobin 20.7. A repeat was done  at 0400 and it demonstrates a hemoglobin of 11.2, white blood cell count 8400, again no leftward shift noted. Urinalysis was an in and out catheter  specimen demonstrating 0 to  2 white cells, few bacteria and 11 to 20 red  blood cells. CMET demonstrates normal electrolytes, normal BUN and  creatinine. She has very mild elevation of her SGOT at  39 and SGPT at 58.  Lipase was normal at 20. Occult blood is negative in the stool.   Abdominal x-rays and chest demonstrate no acute chest disease. There was  constipation noted with obvious stool. A CT scan of the abdomen and pelvis  is as per HPI.   IMPRESSION:  Abdominal pain, right flank, right lower quadrant and some of  the left lower quadrant as well. She is hyperesthetic in the right lower  quadrant region. A CT scan is not consistent with any acute inflammatory or  infectious process. The appendix is well seen on the CT scan and by CT  criteria is not consistent with appendicitis. Her  presentation is also  atypical for appendicitis. She is quite hyperesthetic on the anterior  abdominal wall on the right side, although no rash is noted. Differential  diagnosis could be early appendicitis, although 5 days of symptoms I would  expect to see something at this time. Possibly also gastroenteritis.  Shingles is a possibility, however, she does not have any obvious rash. She  could just be constipated but a relatively high fecal impaction is not  palpable. I do not see any obvious evidence of colitis at this time. She has  some blood in her urine, but it may be from Foley trauma. It does not look  like a definite urinary tract infection.   PLAN:  I have discussed the diagnostic dilemma with her and her mother. What  I have suggested is some enemas to try to relieve the  constipation and  reexamination. We briefly discussed diagnostic laparoscopy, although I told  her with her  presentation and with a CT scan demonstrating basically a  normal type appendix except for a small fecalith, I am not sure appendicitis  is occurring at this time. What we will do is give her some enemas, try to  clean her out, repeat a CBC this  afternoon and reexamine her. They agree  with that plan.                                               Adolph Pollack, M.D.    Kari Baars  D:  11/07/2002  T:  11/07/2002  Job:  [id number]

## 2010-10-21 NOTE — Discharge Summary (Signed)
Brittany Madden, Brittany Madden                ACCOUNT NO.:  [id number]   MEDICAL RECORD NO.:  [id number]          PATIENT TYPE:  INP   LOCATION:  5015                         FACILITY:  [facility]   PHYSICIAN:  Mallory Shirk, MD     DATE OF BIRTH:  10-05-1960   DATE OF ADMISSION:  05/08/2004  DATE OF DISCHARGE:                                 DISCHARGE SUMMARY   DISCHARGE DIAGNOSES:  1.  Right lower extremity cellulitis.  2.  Multiple sclerosis.  3.  Hypertension.  4.  Gastrointestinal reflux disease.   MEDICATIONS ON DISCHARGE:  1.  Claritin 10 mg p.o. daily.  2.  Singulair 10 mg p.o. daily.  3.  Neurontin 600 mg p.o. b.i.d.  4.  Betaseron 0.3 mg subcutaneously every other day.  5.  Mirapex 0.125 mg p.o. b.i.d.  6.  Lopressor 25 mg p.o. b.i.d.  7.  Zelnorm 6 mg p.o. b.i.d.  8.  Senokot p.r.n.  9.  Zantac 150 mg p.o. daily.  10. Crestor 5 mg p.o. q.  11. Vicodin 5/500 mg p.r.n. one tablet q.6h. p.r.n. pain.  12. Depakote 250 mg p.o. daily.  13. Cymbalta 60 mg p.o. daily.  14. Keflex 500 mg p.o. q.i.d. x 5 days.   ALLERGIES:  1.  ASPIRIN.  2.  NSAID.  3.  SEAFOOD.   FOLLOWUP APPOINTMENTS:  The patient will be followed up at the skilled  nursing home by the nursing home physician and Ralene Ok, M.D., as  determined by Dr. Ludwig Clarks.   HISTORY OF PRESENT ILLNESS:  Ms. 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  Also noted was swelling and redness in her  right lower extremity.  Personnel at the nursing home also noted that the  patient was sleeping a little more than usual.  The patient has deceased  sensation in the legs secondary to MS and hence could not exactly say how  much it was hurting.  The patient did say that it did not hurt  that much.   PAST MEDICAL HISTORY:  1.  Multiple sclerosis diagnosed in 1994.  2.  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  3.  Acute renal disease, history of chronic renal disease.  4.  Hypertension.  5.  GERD.   MEDICATIONS ON ADMISSION:  1.  Claritin 10 mg p.o. daily.  2.  Singulair 10 mg p.o. daily.  3.  Neurontin 600 mg p.o. b.i.d.  4.  Betaseron 0.3 mg subcutaneously every other day.  5.  Mirapex 0.125 mg p.o. b.i.d.  6.  Lopressor 25 mg p.o. b.i.d.  7.  Zelnorm 6 mg p.o. b.i.d.  8.  Senokot p.r.n.  9.  Zantac 150 mg p.o. daily.  10. Crestor 5 mg p.o. q.  11. Vicodin 5/500 mg p.r.n. one tablet q.6h. p.r.n. pain.  12. Depakote 250 mg p.o. daily.  13. Cymbalta 60 mg p.o. daily.  14. Keflex 500 mg p.o. q.i.d. x 5 days.   PHYSICAL EXAMINATION ON ADMISSION:  VITAL SIGNS:  Temperature 103.2 degrees,  pulse 126, respirations 28, blood pressure 120/58, saturations 95% on room  air.  Subsequently the pulse was down to 104 with a respiratory rate of 20.  GENERAL APPEARANCE:  A well-developed, obese, African American woman with  slightly slurred speech.  HEENT:  Normocephalic and atraumatic.  EOMI.  Left-sided facial asymmetry  noted.  PERRL.  NECK:  Supple.  No LAD.  No JVD.  LUNGS:  Clear to auscultation bilaterally.  No wheezes.  No rales.  CARDIOVASCULAR:  S1 and S2 regular rate and rhythm.  No murmurs, rubs or  gallops.  ABDOMEN:  Soft.  Positive bowel sounds.  No tenderness.  No masses.  EXTREMITIES:  Strength 2/5 in all four extremities.  Patient able to move  passively.  Right lower extremity erythema, hot to touch, and rash from the  ankle up to the thigh.  NEUROLOGIC:  Dysarthria noted.  Decreased sensory in the lower extremities.  Alert and oriented x 3.  Normal memory, judgment and affect.   LABORATORIES ON ADMISSION:  Sodium 132, BUN 10, creatinine 0.6.  UA  negative.  WBC 16.9.  LFTs normal.  Urine cultures showed 20,000 colonies of  multiples species  with no uropathogens, probable contamination.   HOSPITAL COURSE:  The patient was admitted to the floor.   #1 - RIGHT LOWER EXTREMITY CELLULITIS:  The patient was started on Zosyn and  tobramycin.  Her blood cultures were negative.  On the day of discharge, the  patient's erythema and edema in the right lower extremity had subsided.  The  rash had disappeared.  The extremity was not warm to touch.  The patient was  afebrile for over 72 hours prior to discharge.   The patient will be discharged on Keflex 500 mg p.o. q.i.d. x 5 days.   #2 - MULTIPLE SCLEROSIS:  Her Betaseron 0.3 mg subcutaneously every other  day was continued.  This will be continued on discharge.   #3 - HYPERTENSION:  The patient's antihypertensives were resumed.  Her blood  pressure was well controlled during her hospital stay.   On the day of discharge, the patient's vital signs were as follows:  The  blood pressure was 110/66, pulse 76, temperature 97.1 degrees and  respiratory rate 18 with saturations of 94%.  WBC 8.2, hemoglobin 11.5,  hematocrit 33.1, platelets 218.  BUN 3, creatinine 0.5, sodium 139,  potassium 4.4, chloride 107, carbon dioxide 24, glucose 96, calcium 8.7.  The patient was discharged in stable condition.  She will be transferred to  a skilled nursing facility.  Case management's help in transfer to the  skilled nursing facility is appreciated.       GDK/MEDQ  D:  05/13/2004  T:  05/13/2004  Job:  [id number]   cc:   Ralene Ok, M.D.  [address]  [place]  [place] [address]  Fax: [phone]

## 2010-10-21 NOTE — Op Note (Signed)
   NAMEGWENDLYON, ZUMBRO NO.:  [id number]   MEDICAL RECORD NO.:  [id number]                   PATIENT TYPE:  OIB   LOCATION:  2888                                 FACILITY:  [facility]   PHYSICIAN:  Myrtie Neither, M.D.                 DATE OF BIRTH:  1960/11/20   DATE OF PROCEDURE:  03/20/2003  DATE OF DISCHARGE:                                 OPERATIVE REPORT   PREOPERATIVE DIAGNOSIS:  Infected right great ingrown toenail with  granuloma.   POSTOPERATIVE DIAGNOSIS:  Infected right great ingrown toenail with  granuloma.   PROCEDURE:  Irrigation, debridement, and partial nail excision of right  great toenail.   The patient was taken to the operating room after given adequate medication,  general anesthesia, and intubated. The right foot was prepped with Duraprep  and draped in a stable manner. With a Freer, the nailbed of the medial  border was undermined and lifted up. Purulent material was evacuated.  The  nail bone cutter was used to resect the nail. The nailbed was resected and  curetted with removal of the granuloma.  Copious antibiotic irrigation was  then done. The medial border was then closed with 2-0 nylon and a  compressive dressing was applied. The patient tolerated the procedure well  and was taken to the recovery room in stable and satisfactory condition.   The patient is being discharged back to [place] with ice packs,  elevation, and fracture shoe. The patient is to continue the Cipro 250 mg  b.i.d. and return to the office in one week. The patient is being discharged  in stable and satisfactory condition.                                               Myrtie Neither, M.D.    AC/MEDQ  D:  03/20/2003  T:  03/20/2003  Job:  [id number]

## 2010-10-21 NOTE — H&P (Signed)
NAMEFORESTINE, MACHO NO.:  [id number]   MEDICAL RECORD NO.:  [id number]                   PATIENT TYPE:  OIB   LOCATION:  2888                                 FACILITY:  [facility]   PHYSICIAN:  Myrtie Neither, M.D.                 DATE OF BIRTH:  1960/09/06   DATE OF ADMISSION:  03/20/2003  DATE OF DISCHARGE:                                HISTORY & PHYSICAL   CHIEF COMPLAINT:  Painful bleeding, right great toenail.   HISTORY OF PRESENT ILLNESS:  This is a 50 year old female who resides in a  nursing home, who had been treated for bleeding from her right great toenail  over the past seven months with no improvement. The patient has been having  episodes of bleeding from the nailbed. The patient at some point had been  treated with antibiotics but continued to recurrence of bleeding and  drainage from the nailbed.   PAST MEDICAL HISTORY:  1. Multiple sclerosis.  2. Cocaine overdose resulting in myocardial infarction and CVA with residual     lower extremity weakness.  Also, acute renal failure related to the same.  3. History of chronic renal insufficiency.  4. Tubal ligation.  5. Tracheostomy.  6. Gastrostomy.  7. Lower extremity surgery.  8. History of hypertension.  9. Reflux.   MEDICATIONS:  1. Depakote 250 mg daily.  2. Lasix 20 mg daily.  3. Lexapro 20 mg daily.  4. Betaseron 250 mcg subcu every other day.  5. Lopressor 75 mg b.i.d.  6. Mirapex 25 mg b.i.d.  7. Senokot one at bedtime.  8. Ambien 10 mg at bedtime.  9. Colace 100 mg at bedtime.  10.      Zantac 150 mg daily.  11.      Seroquel 25 mg at bedtime.  12.      Vicodin q.4h. p.r.n. pain.  13.      Phenergan 25 mg q.6h. p.r.n.   ALLERGIES:  ASPIRIN, NSAIDS, AND SEAFOOD.   SOCIAL HISTORY:  The patient resides at [facility].  Has  a living mother.  Presently, the patient denies use of cigarettes or  alcohol.   REVIEW OF SYSTEMS:  Reflux esophagitis,  symptoms of renal insufficiency,  weakness of both lower extremities, some shortness of breath at times.   FAMILY HISTORY:  Noncontributory but positive for sickle cell disease and  hypertension.   PHYSICAL EXAMINATION:  VITAL SIGNS: Temperature 97.1, pulse 69, respirations  16, blood pressure 115/75.  GENERAL: Alert and oriented, in no acute distress.  HEENT:  Head is normocephalic. The patient has speech difficulty from  previous CVA.  CHEST: Clear.  CARDIAC: S1 and S2 regular.  EXTREMITIES: Right foot with obviously infected ingrown toenail with  granuloma over the medial border; the nailbed is tender.   IMPRESSION:  Infected ingrown toenail with granuloma, right great toenail.   PLAN:  Irrigation, debridement, partial nail excision of right great  toenail.                                                Myrtie Neither, M.D.    AC/MEDQ  D:  03/20/2003  T:  03/20/2003  Job:  [id number]

## 2010-10-21 NOTE — H&P (Signed)
Brittany Madden, Brittany Madden                ACCOUNT NO.:  [id number]   MEDICAL RECORD NO.:  [id number]          PATIENT TYPE:  EMS   LOCATION:  MAJO                         FACILITY:  [facility]   PHYSICIAN:  Jonna L. Robb Matar, M.D.DATE OF BIRTH:  [birth year]   DATE OF ADMISSION:  05/08/2004  DATE OF DISCHARGE:                                HISTORY & PHYSICAL   CHIEF COMPLAINT:  Altered mental status.   HISTORY:  This 50 year old African American female resident of a nursing  home was very lethargic and having chills and fever, and it was noticed that  she had swelling and redness in her right lower extremity, and so she was  transferred to the emergency room.  She has been having fever and chills,  and sleeping a lot more than usual, but she does not have that much feeling  in her leg, though she says it does not really hurt that much.   PAST MEDICAL HISTORY:  1.  Multiple sclerosis since 1994.  2.  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  3.  Acute renal disease during that time which gradually subsided.  There      was supposedly some history of chronic renal disease as well.  4.  Hypertension.  5.  GERD.   ALLERGIES:  ASPIRIN, NSAIDS and SEAFOOD.   MEDICATIONS:  1.  Claritin 10 mg daily.  2.  Singulair 10 mg daily.  3.  Neurontin 600 mg b.i.d.  4.  Betaseron 0.3 mg subcu every other day.  5.  Mirapex 0.125 mg b.i.d.  6.  Lopressor 25 mg b.i.d.  7.  Zelnorm 6 mg b.i.d.  8.  Senokot.  9.  Zantac.  10. Crestor 5 mg daily.  11. Vicodin p.r.n.  12. Depakote 250 mg daily.  13. Cymbalta 60 mg daily.   OPERATIONS:  1.  Tubal ligation.  2.  Tracheostomy.  3.  Gastrostomy.  4.  Surgery on her legs.   FAMILY HISTORY:  Sickle cell anemia and hypertension.   SOCIAL HISTORY:  The patient is a nonsmoker, nondrinker, lives in [place], cannot roll over in bed or feed herself.   REVIEW OF SYSTEMS:  The patient has some problems with dry eyes, no problems  with  dysphagia, can eat regular food, no coughing, wheezing, no chest pain,  no breast problems.  She has chronic constipation problems and says her  present medications are not working that well.  No hematemesis or melena.  No history of diabetes.  She has eczema around her neck for which she uses a  cortisone-based cream on.  No arthritic complaints.  Some problems with  depression.  A lot of neurologic problems with stiffness, leg cramping,  pain.   PHYSICAL EXAM:  VITAL SIGNS:  Temperature 103.2, pulse 126, respirations 28,  blood pressure 120/58, saturation 95% on admission; pulse is now down to  about 104, respiratory rate 20.  GENERAL:  She is a well-developed, overweight African American female with  slurred speech.  HEENT:  She has some mild conjunctivitis.  Pupils are reactive.  EOMs are  full.  Normal hearing and mucosa.  NECK:  Neck shows no masses, thyromegaly or carotid bruit.  SKIN:  She does have a hyperpigmented rash and roughness to the skin.  LUNGS:  Respiratory effort was normal.  The lungs are clear to A&P without  wheezing, rales, rhonchi or dullness.  HEART:  Heart has a regular rate and rhythm, normal S1 and S2, without  murmurs, rubs, or gallops.  There is no cyanosis or clubbing.  She has some  trace peripheral edema bilaterally.  BREASTS:  Breasts show no masses, tenderness or discharge.  ABDOMEN:  Abdomen is nontender, normal bowel sounds.  No hepatosplenomegaly  or hernias.  GU:  Normal external genitalia.  LYMPHATICS:  No adenopathy.  EXTREMITIES:  The patient does have 1-2/5, all 4 extremities.  She can  passively be moved.  The right lower extremity has the erythematous swollen  hot rash from the ankle all the way up to the thigh.  NEUROLOGIC:  She has dysarthria, decreased sensation.  She is alert and  oriented x3.  Normal memory, judgment and affect.   LABORATORY DATA:  Sodium is 132, BUN 10, creatinine 0.6.  Urinalysis is  normal.  White count is 16.9.   LFTs are normal.  Urine and blood cultures  are pending.   IMPRESSION:  1.  Right lower extremity cellulitis.  I will treat this with some Zosyn and      Tobramycin, pending cultures.  2.  Multiple sclerosis.  I will continue her present medicines.  3.  Old cerebrovascular accident.  4.  Hyperlipidemia.  5.  Allergic rhinitis.      Jonn   JLB/MEDQ  D:  05/08/2004  T:  05/09/2004  Job:  [id number]   cc:   Ralene Ok, M.D.  [address]  [place]  [place] [address]  Fax: [phone]

## 2010-10-21 NOTE — Discharge Summary (Signed)
Brittany Madden, Brittany Madden                            ACCOUNT NO.:  [id number]   MEDICAL RECORD NO.:  [id number]                   PATIENT TYPE:  INP   LOCATION:  5735                                 FACILITY:  [facility]   PHYSICIAN:  Thornton Park. Daphine Deutscher, M.D.             DATE OF BIRTH:  1961-04-30   DATE OF ADMISSION:  11/06/2002  DATE OF DISCHARGE:  11/12/2002                                 DISCHARGE SUMMARY   CHIEF COMPLAINT:  Right sided abdominal pain.   HISTORY:  This 50 year old female who on the Monday prior to admission had  onset of nausea and vomiting and abdominal pain. She was seen by the folks  at [facility] and was treated with a Dulcolax suppository. She was  brought to the emergency room because of pain.   PAST MEDICAL HISTORY:  Remarkable in that she has MS treated by Dr. 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 Acute renal failure related to #2,  treated at [facility]. History of cocaine overdose. Chronic renal  insufficiency.   PAST SURGICAL HISTORY:  Includes lower extremity surgery, bilateral tubal  ligation, tracheostomy, gastrostomy.   ALLERGIES:  ASPIRIN, NSAIDS, and SEAFOOD.   CURRENT MEDICATIONS:  1. Betaseron 250 mcg subcu every other day.  2. Depakote 250 mg daily.  3. Lasix 20 mg daily.  4. Mirapex 25 mg b.i.d.  5. Lopressor 75 mg b.i.d.  6. Senokot one q.h.s.  7. Ambien 10 mg q.h.s.  8. Colace 100 mg q.h.s.  9. Zantac 150 mg daily.  10.      Seroquel 25 mg q.h.s.  11.      Vicodin.   COURSE IN THE HOSPITAL:  Brittany Madden was admitted by Dr. Abbey Chatters, and CT  scan was obtained which showed that it was only remarkable in her entire  colon was packed with stool. She did have a small appendix fecalith, but the  appendix itself filled out with contrast, and there was no evidence of  inflammatory change around it. Laboratory was not elevated for any kind of  acute  diverticulitis or appendicitis. Instead, the patient has been treated  with MiraLax over several days and has had a good amount of stool, relieving  almost all of her pain. She still has a slight residual tenderness on the  right that is markedly better. She is cleared for discharge on November 12, 2002.   DISCHARGE MEDICATIONS:  Patient should resume her previous medications.   DISPOSITION:  Discharge will be facilitated by case management.   FINAL DIAGNOSES:  As mentioned above.   REASON FOR CURRENT ADMISSION:  Abdominal pain related to obstipation status  post clearing with cathartics.  Thornton Park Daphine Deutscher, M.D.    MBM/MEDQ  D:  11/12/2002  T:  11/12/2002  Job:  [id number]

## 2011-02-08 ENCOUNTER — Encounter: Payer: Self-pay | Admitting: Cardiothoracic Surgery

## 2011-02-08 ENCOUNTER — Encounter: Payer: Self-pay | Admitting: Nurse Practitioner

## 2011-03-06 ENCOUNTER — Encounter: Payer: Self-pay | Admitting: Cardiothoracic Surgery

## 2011-03-06 ENCOUNTER — Encounter: Payer: Self-pay | Admitting: Nurse Practitioner

## 2011-04-06 ENCOUNTER — Encounter: Payer: Self-pay | Admitting: Nurse Practitioner

## 2011-04-06 ENCOUNTER — Encounter: Payer: Self-pay | Admitting: Cardiothoracic Surgery

## 2011-06-28 ENCOUNTER — Ambulatory Visit: Payer: Self-pay | Admitting: Geriatric Medicine

## 2011-11-17 ENCOUNTER — Other Ambulatory Visit: Payer: Self-pay | Admitting: Geriatric Medicine

## 2011-12-27 ENCOUNTER — Emergency Department: Payer: Self-pay | Admitting: Emergency Medicine

## 2011-12-28 LAB — COMPREHENSIVE METABOLIC PANEL
Alkaline Phosphatase: 77 U/L (ref 50–136)
BUN: 17 mg/dL (ref 7–18)
Bilirubin,Total: 0.5 mg/dL (ref 0.2–1.0)
Creatinine: 0.38 mg/dL — ABNORMAL LOW (ref 0.60–1.30)
Osmolality: 288 (ref 275–301)
SGPT (ALT): 44 U/L
Sodium: 144 mmol/L (ref 136–145)
Total Protein: 8.4 g/dL — ABNORMAL HIGH (ref 6.4–8.2)

## 2011-12-28 LAB — CBC WITH DIFFERENTIAL/PLATELET
Basophil #: 0.1 10*3/uL (ref 0.0–0.1)
Basophil %: 1.1 %
Lymphocyte %: 6.4 %
Monocyte %: 6.6 %
Neutrophil #: 9.8 10*3/uL — ABNORMAL HIGH (ref 1.4–6.5)
Neutrophil %: 85.7 %

## 2011-12-28 LAB — URINALYSIS, COMPLETE
Bilirubin,UR: NEGATIVE
Glucose,UR: NEGATIVE mg/dL (ref 0–75)
Protein: 100
RBC,UR: 9 /HPF (ref 0–5)
WBC UR: 49 /HPF (ref 0–5)

## 2012-02-06 ENCOUNTER — Ambulatory Visit: Payer: Self-pay | Admitting: Geriatric Medicine

## 2012-04-21 ENCOUNTER — Other Ambulatory Visit: Payer: Self-pay

## 2012-05-24 ENCOUNTER — Other Ambulatory Visit: Payer: Self-pay

## 2012-06-07 ENCOUNTER — Other Ambulatory Visit: Payer: Self-pay | Admitting: Pediatrics

## 2012-06-07 LAB — PROTIME-INR: Prothrombin Time: 15.3 secs — ABNORMAL HIGH (ref 11.5–14.7)

## 2012-06-14 ENCOUNTER — Other Ambulatory Visit: Payer: Self-pay

## 2012-06-14 ENCOUNTER — Ambulatory Visit: Payer: Self-pay

## 2012-06-14 LAB — PROTIME-INR: INR: 1.1

## 2012-07-11 ENCOUNTER — Other Ambulatory Visit: Payer: Self-pay | Admitting: Geriatric Medicine

## 2012-09-18 ENCOUNTER — Other Ambulatory Visit: Payer: Self-pay | Admitting: Family Medicine

## 2012-09-28 ENCOUNTER — Inpatient Hospital Stay: Payer: Self-pay | Admitting: Internal Medicine

## 2012-09-28 LAB — URINALYSIS, COMPLETE
Ketone: NEGATIVE
Nitrite: NEGATIVE
Protein: >=500
RBC,UR: 141 /HPF (ref 0–5)
Specific Gravity: 1.015 (ref 1.003–1.030)
WBC UR: 48 /HPF (ref 0–5)

## 2012-09-28 LAB — COMPREHENSIVE METABOLIC PANEL
Alkaline Phosphatase: 130 U/L (ref 50–136)
Anion Gap: 13 (ref 7–16)
Bilirubin,Total: 0.9 mg/dL (ref 0.2–1.0)
Calcium, Total: 9.4 mg/dL (ref 8.5–10.1)
Chloride: 106 mmol/L (ref 98–107)
Co2: 18 mmol/L — ABNORMAL LOW (ref 21–32)
EGFR (African American): 51 — ABNORMAL LOW
Osmolality: 283 (ref 275–301)
Potassium: 4.8 mmol/L (ref 3.5–5.1)
SGPT (ALT): 47 U/L (ref 12–78)

## 2012-09-28 LAB — TSH: Thyroid Stimulating Horm: 0.677 u[IU]/mL

## 2012-09-28 LAB — CBC: Platelet: 181 10*3/uL (ref 150–440)

## 2012-09-29 LAB — BASIC METABOLIC PANEL
Anion Gap: 9 (ref 7–16)
BUN: 19 mg/dL — ABNORMAL HIGH (ref 7–18)
Calcium, Total: 7.9 mg/dL — ABNORMAL LOW (ref 8.5–10.1)
Co2: 19 mmol/L — ABNORMAL LOW (ref 21–32)
Glucose: 93 mg/dL (ref 65–99)
Osmolality: 298 (ref 275–301)

## 2012-09-29 LAB — MAGNESIUM: Magnesium: 1.3 mg/dL — ABNORMAL LOW

## 2012-09-29 LAB — CBC WITH DIFFERENTIAL/PLATELET
Basophil %: 0.4 %
Eosinophil #: 0 10*3/uL (ref 0.0–0.7)
HGB: 12.1 g/dL (ref 12.0–16.0)
Lymphocyte #: 0.8 10*3/uL — ABNORMAL LOW (ref 1.0–3.6)
Monocyte #: 1.5 x10 3/mm — ABNORMAL HIGH (ref 0.2–0.9)
Neutrophil #: 32.1 10*3/uL — ABNORMAL HIGH (ref 1.4–6.5)
Neutrophil %: 92.9 %
Platelet: 121 10*3/uL — ABNORMAL LOW (ref 150–440)
RDW: 15.6 % — ABNORMAL HIGH (ref 11.5–14.5)
WBC: 34.6 10*3/uL — ABNORMAL HIGH (ref 3.6–11.0)

## 2012-09-30 LAB — HEPATIC FUNCTION PANEL A (ARMC)
Albumin: 1.9 g/dL — ABNORMAL LOW (ref 3.4–5.0)
Alkaline Phosphatase: 91 U/L (ref 50–136)
Bilirubin,Total: 0.7 mg/dL (ref 0.2–1.0)
SGOT(AST): 54 U/L — ABNORMAL HIGH (ref 15–37)
Total Protein: 5.9 g/dL — ABNORMAL LOW (ref 6.4–8.2)

## 2012-09-30 LAB — CBC WITH DIFFERENTIAL/PLATELET
Basophil %: 0.2 %
Eosinophil #: 0.3 10*3/uL (ref 0.0–0.7)
HCT: 32.1 % — ABNORMAL LOW (ref 35.0–47.0)
Lymphocyte %: 4.5 %
MCH: 27 pg (ref 26.0–34.0)
MCV: 80 fL (ref 80–100)
Monocyte #: 0.7 x10 3/mm (ref 0.2–0.9)
Monocyte %: 3.4 %
Neutrophil #: 19.6 10*3/uL — ABNORMAL HIGH (ref 1.4–6.5)
RBC: 4.02 10*6/uL (ref 3.80–5.20)
RDW: 16 % — ABNORMAL HIGH (ref 11.5–14.5)
WBC: 21.7 10*3/uL — ABNORMAL HIGH (ref 3.6–11.0)

## 2012-09-30 LAB — BASIC METABOLIC PANEL
Chloride: 114 mmol/L — ABNORMAL HIGH (ref 98–107)
Co2: 19 mmol/L — ABNORMAL LOW (ref 21–32)
EGFR (Non-African Amer.): >60
Glucose: 79 mg/dL (ref 65–99)
Sodium: 144 mmol/L (ref 136–145)

## 2012-09-30 LAB — CULTURE, BLOOD (SINGLE)

## 2012-09-30 LAB — URINE CULTURE

## 2012-10-01 LAB — CBC WITH DIFFERENTIAL/PLATELET
Eosinophil #: 0.3 10*3/uL (ref 0.0–0.7)
Eosinophil %: 1.8 %
HGB: 11.8 g/dL — ABNORMAL LOW (ref 12.0–16.0)
Lymphocyte #: 1.3 10*3/uL (ref 1.0–3.6)
Lymphocyte %: 7.1 %
Monocyte #: 0.7 x10 3/mm (ref 0.2–0.9)
Monocyte %: 3.6 %
Platelet: 107 10*3/uL — ABNORMAL LOW (ref 150–440)
RDW: 15.9 % — ABNORMAL HIGH (ref 11.5–14.5)

## 2012-10-01 LAB — BASIC METABOLIC PANEL
Anion Gap: 4 — ABNORMAL LOW (ref 7–16)
Chloride: 112 mmol/L — ABNORMAL HIGH (ref 98–107)
Co2: 25 mmol/L (ref 21–32)
Creatinine: 0.35 mg/dL — ABNORMAL LOW (ref 0.60–1.30)
EGFR (African American): >60
Osmolality: 277 (ref 275–301)
Potassium: 4.1 mmol/L (ref 3.5–5.1)

## 2012-10-01 LAB — MAGNESIUM: Magnesium: 1.1 mg/dL — ABNORMAL LOW

## 2012-10-01 LAB — PROTIME-INR: INR: 2.5

## 2012-10-02 LAB — BASIC METABOLIC PANEL
Anion Gap: 5 — ABNORMAL LOW (ref 7–16)
BUN: 5 mg/dL — ABNORMAL LOW (ref 7–18)
Chloride: 111 mmol/L — ABNORMAL HIGH (ref 98–107)
EGFR (Non-African Amer.): >60
Glucose: 86 mg/dL (ref 65–99)
Osmolality: 280 (ref 275–301)
Sodium: 142 mmol/L (ref 136–145)

## 2012-10-02 LAB — CBC WITH DIFFERENTIAL/PLATELET
Basophil #: 0 10*3/uL (ref 0.0–0.1)
Eosinophil #: 0.3 10*3/uL (ref 0.0–0.7)
Eosinophil %: 3.1 %
HCT: 33.6 % — ABNORMAL LOW (ref 35.0–47.0)
HGB: 11.6 g/dL — ABNORMAL LOW (ref 12.0–16.0)
Lymphocyte #: 1.5 10*3/uL (ref 1.0–3.6)
MCH: 27.4 pg (ref 26.0–34.0)
Monocyte #: 0.7 x10 3/mm (ref 0.2–0.9)
Monocyte %: 7.6 %
Neutrophil %: 71.6 %
Platelet: 116 10*3/uL — ABNORMAL LOW (ref 150–440)
RBC: 4.23 10*6/uL (ref 3.80–5.20)

## 2012-10-02 LAB — PROTIME-INR: Prothrombin Time: 20.5 secs — ABNORMAL HIGH (ref 11.5–14.7)

## 2012-10-03 LAB — BASIC METABOLIC PANEL
Calcium, Total: 9.4 mg/dL (ref 8.5–10.1)
Creatinine: 0.35 mg/dL — ABNORMAL LOW (ref 0.60–1.30)
EGFR (Non-African Amer.): >60
Glucose: 77 mg/dL (ref 65–99)
Osmolality: 276 (ref 275–301)
Sodium: 140 mmol/L (ref 136–145)

## 2012-10-04 LAB — PROTIME-INR
INR: 1.6
Prothrombin Time: 18.6 secs — ABNORMAL HIGH (ref 11.5–14.7)

## 2012-10-05 ENCOUNTER — Other Ambulatory Visit: Payer: Self-pay

## 2012-10-05 LAB — PROTIME-INR
INR: 1.7
Prothrombin Time: 19.7 secs — ABNORMAL HIGH (ref 11.5–14.7)

## 2012-10-06 ENCOUNTER — Other Ambulatory Visit: Payer: Self-pay

## 2012-10-06 LAB — PROTIME-INR: INR: 1.6

## 2012-10-12 ENCOUNTER — Other Ambulatory Visit: Payer: Self-pay | Admitting: Family Medicine

## 2012-10-12 LAB — PROTIME-INR
INR: 1.7
Prothrombin Time: 19.4 secs — ABNORMAL HIGH (ref 11.5–14.7)

## 2012-10-30 ENCOUNTER — Ambulatory Visit: Payer: Self-pay | Admitting: Interventional Cardiology

## 2013-07-14 ENCOUNTER — Ambulatory Visit: Payer: Self-pay | Admitting: Gastroenterology

## 2014-01-15 ENCOUNTER — Other Ambulatory Visit: Payer: Self-pay | Admitting: Family Medicine

## 2014-02-02 IMAGING — CT CT CHEST W/O CM
1 series · 15 of 33 positions shown, 19 images · non-contrast
Comparison: 04/07/2009

REASON FOR EXAM: evaluate for pneumoniad aspiration.
COMMENTS:

PROCEDURE:     CT  - CT CHEST WITHOUT CONTRAST  - September 28, 2012 [DATE]
RESULT:     Indication: Pneumonia
TECHNIQUE: Multiple axial images of the chest are obtained without
intravenous contrast.

[Series 2: soft tissue · axial · 0.57mm/px · z∈[-332,-80]mm · 15 of 100 slices shown, 19 images]
[im 8/100  mediastinal]
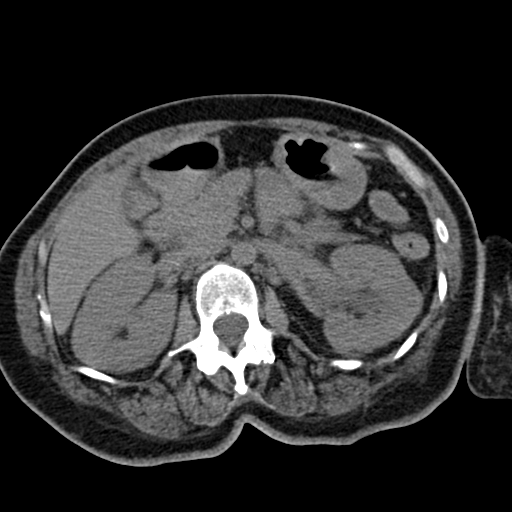
[im 8/100  lung]
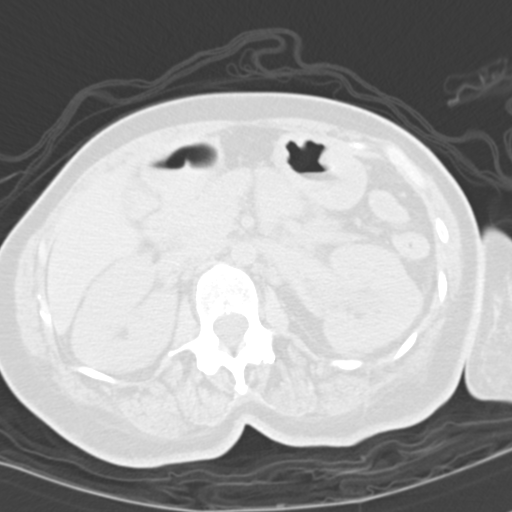
[im 15/100  lung]
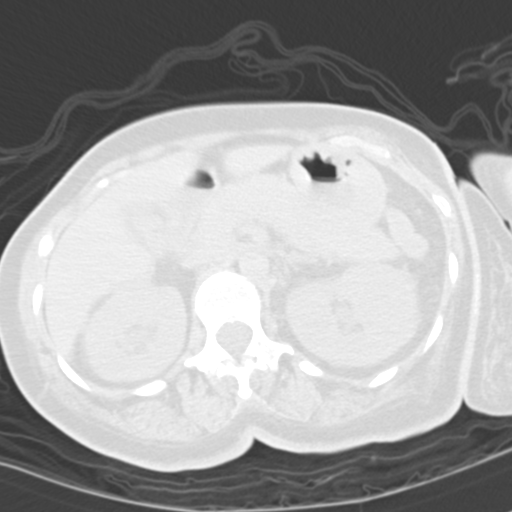
[im 20/100  lung]
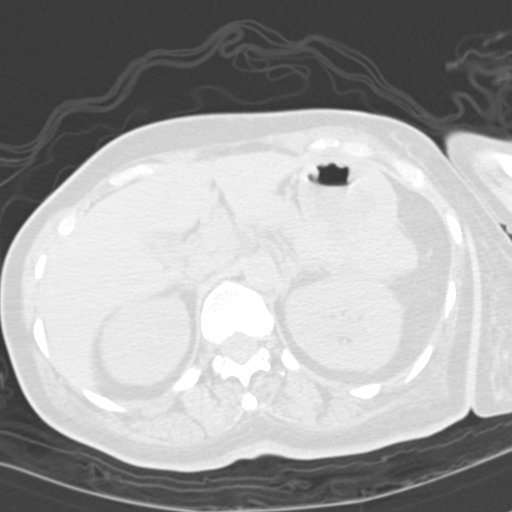
[im 26/100  lung]
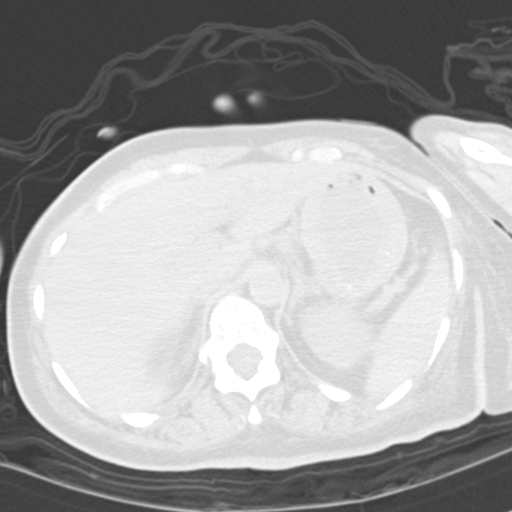
[im 34/100  mediastinal]
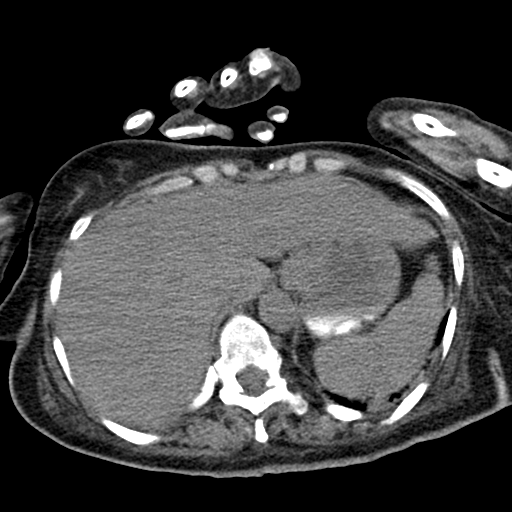
[im 34/100  lung]
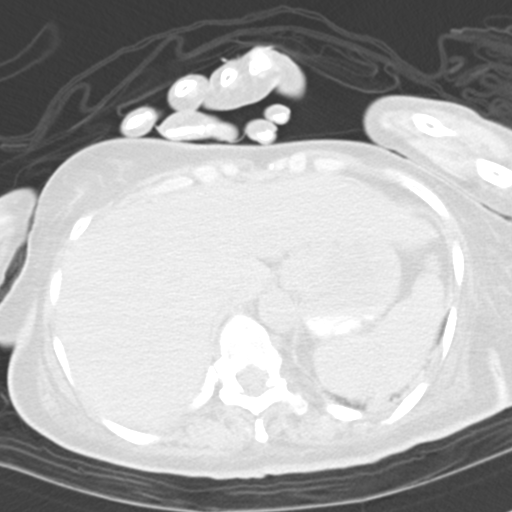
[im 40/100  lung]
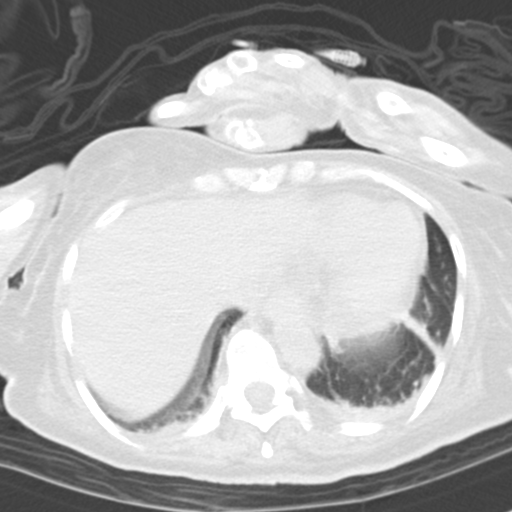
[im 45/100  lung]
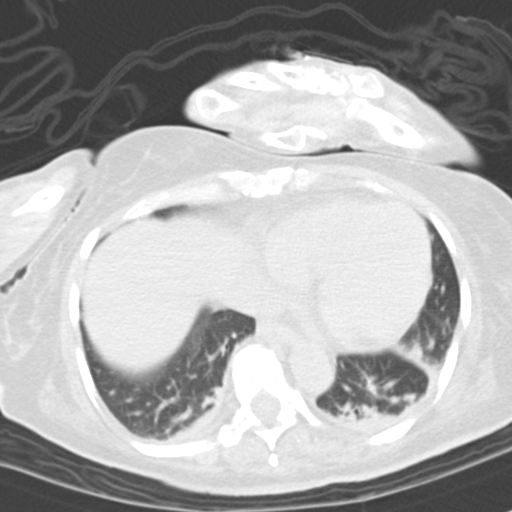
[im 52/100  lung]
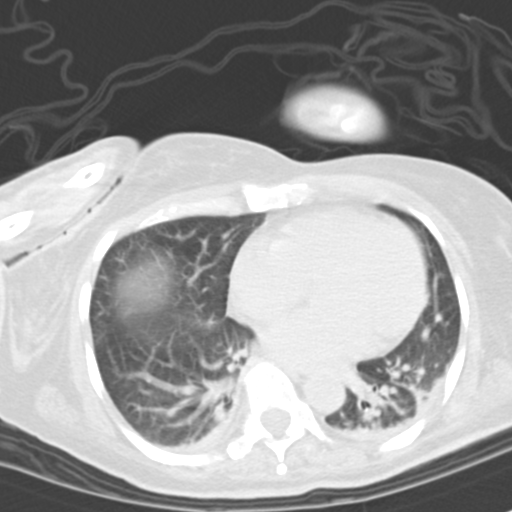
[im 56/100  mediastinal]
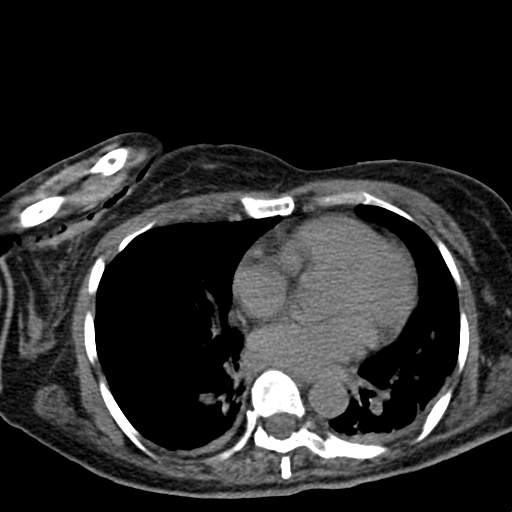
[im 56/100  lung]
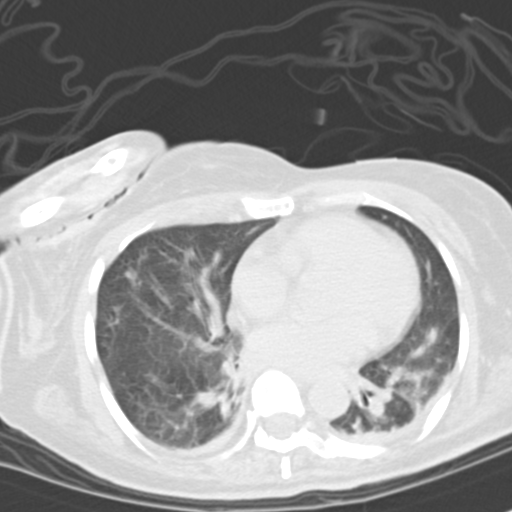
[im 60/100  lung]
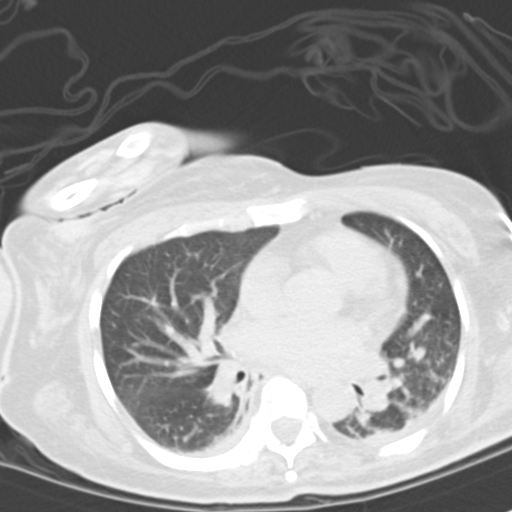
[im 67/100  lung]
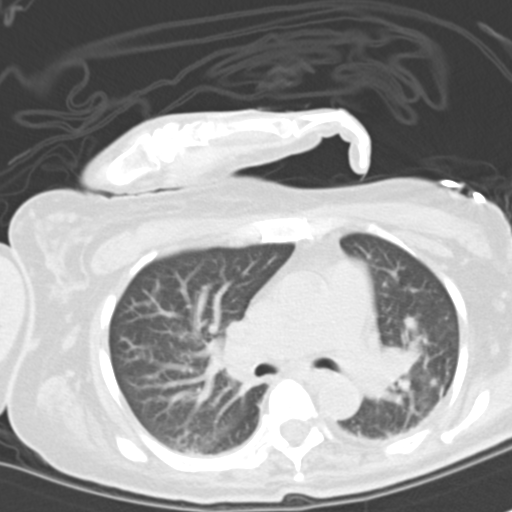
[im 74/100  lung]
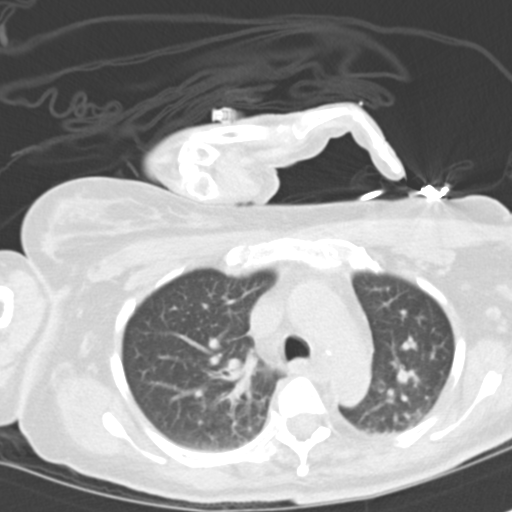
[im 80/100  mediastinal]
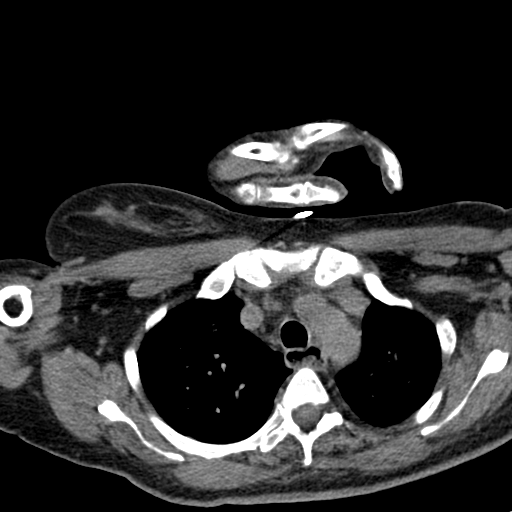
[im 80/100  lung]
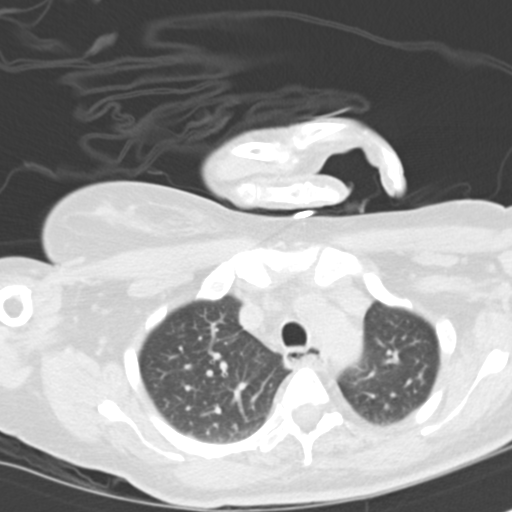
[im 85/100  lung]
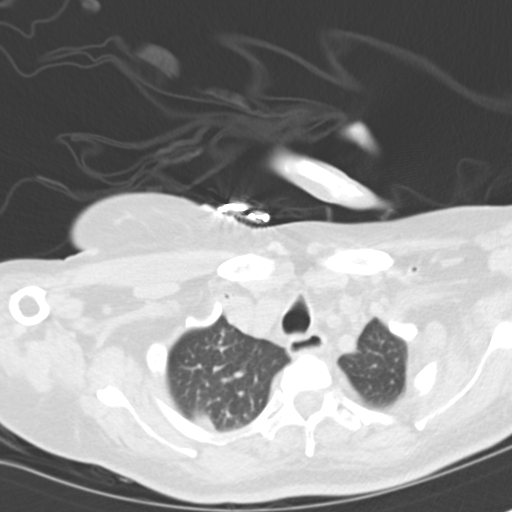
[im 92/100  lung]
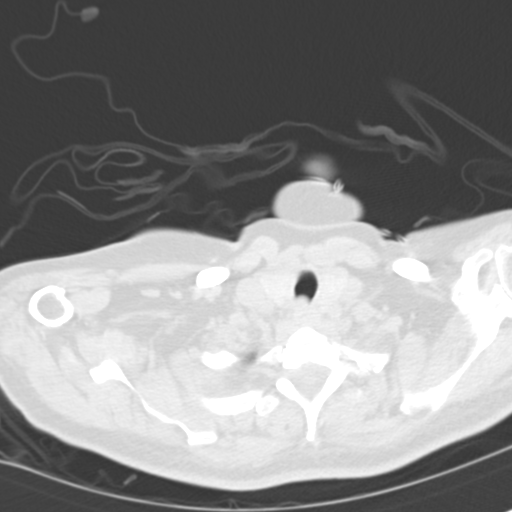

[15 of 33 positions shown; findings below may reference images not displayed]

FINDINGS: The central airways are patent. There is mild bibasilar airspace disease.
There is no focal consolidation. There is no pleural effusion or
pneumothorax. There is prominence of the central pulmonary vasculature.
There is no pleural effusion or pneumothorax.

There are no pathologically enlarged axillary, hilar, or mediastinal lymph
nodes.

The heart size is normal. There is no pericardial effusion. The thoracic
aorta is normal in caliber.

Review of bone windows demonstrates no focal lytic or sclerotic lesions.

Limited noncontrast images of the upper abdomen were obtained. The adrenal
glands appear normal. The remainder of the visualized abdominal organs are
unremarkable.
IMPRESSION: 1. There is bibasilar mild airspace disease which may reflect atelectasis
given that it is located dependently, but aspiration pneumonitis cannot be
excluded.

[REDACTED]

## 2014-06-18 ENCOUNTER — Other Ambulatory Visit: Payer: Self-pay | Admitting: Oncology

## 2014-07-15 ENCOUNTER — Ambulatory Visit: Payer: Self-pay

## 2014-08-08 ENCOUNTER — Other Ambulatory Visit: Payer: Self-pay

## 2014-09-25 NOTE — Discharge Summary (Signed)
PATIENT NAME:  Brittany Madden, PIES MR#:  [id number] DATE OF BIRTH:  07-19-1960  DATE OF ADMISSION:  09/28/2012 DATE OF DISCHARGE: 10/04/2012   PRIMARY CARE PHYSICIAN: Glenetta Borg, MD, at [facility].    PRESENTING COMPLAINT: Altered mental status.   DISCHARGE DIAGNOSES:  1. Septic shock, resolved.  2. Proteus mirabilis septicemia, source urinary tract infection secondary to chronic Foley.  3. Functional quadriplegia secondary to multiple sclerosis. The patient is bedbound.  4. Suspected aspiration pneumonitis, right lower lobe.  5. Acute renal failure/acute tubular necrosis secondary to hypotension and septic shock, resolved.  6. Metabolic acidosis due to septic shock.  7. Gastroesophageal reflux disease. 8. Depression.  9. Chronic anticoagulation due to history of deep venous thrombosis and pulmonary embolism. The patient back on warfarin.  10. Hypomagnesemia.   CODE STATUS: Full code.   MEDICATIONS AT DISCHARGE:  1. Levaquin 500 mg p.o. daily, last dose 10/13/2012.  2. Mag-Ox 400 mg daily.  3. Warfarin 4.5 mg at 5:00 p.m.  4. Juven protein powder as before.  5. Depo-Provera 150 mg every 3 months.  6. Vitamin D3 50,000 units monthly.  7. Bisacodyl 10 mg rectal suppository p.r.n.  8. Toprol-XL 50 mg daily.  9. Benadryl 25 mg q.6 p.r.n.  10. Omeprazole 20 mg daily.  11. Calcium with vitamin D 2 tablets b.i.d.  12. Multivitamin p.o. daily.  13. Folic acid 1 mg daily.  14. Betaseron 1 mL subcutaneous shot every other day.  15. MiraLax 17 grams orally daily p.r.n.  16. Baclofen 5 mg t.i.d.  17. Biotene Moisturizing Mouth Spray 2 sprays q.2 hours as needed.  18. Refresh Celluvisc ophthalmic drop 1 drop both eyes b.i.d.  19. Zanaflex 8 mg q.i.d.  20. Tylenol 650 q.4 p.r.n.  21. Lexapro 10 mg daily.  22. Neurontin 600 mg b.i.d.  23. Atorvastatin 20 mg daily.   DISCHARGE INSTRUCTIONS: 1. Dysphagia 1 thickened liquids.  2. Ensure daily.  3. Check PT/INR daily until INR  is therapeutic, which is more than 2.  4. Foley care per protocol.   CONSULTATION: Infectious disease, Dr. Leavy Cella.   BRIEF SUMMARY OF HOSPITAL COURSE: The patient is a pleasant 54 year old female with a history of multiple sclerosis, depression, hyperlipidemia and history of PE/DVT who comes in with altered mental status. She is a resident at [facility] who was brought in with:   1. Altered mental status/encephalopathy, which was suspected due to infection. The patient's mental status is now back to baseline.  2. Septic shock due to Proteus septicemia, source urinary tract infection due to chronic Foley. The patient had fever of 101, tachycardia, altered mental status and hypotensive on admission. She was resuscitated with IV fluids, started on Levaquin, and her blood cultures grew Proteus which was sensitive to Levaquin. A total of 14 days of treatment is recommended by Dr. Leavy Cella, infectious disease.  3. Aspiration pneumonitis as seen on CT scan in right lower lobe. The patient chronically seems to aspirate. Dietary precautions were taken out. Speech was consulted, and did see positive aspiration documented on swallow evaluation. Dysphagia 1 diet is recommended. The patient is currently on 2 liters nasal cannula oxygen. Her sats are stable.   4. Multiple sclerosis. Every-other-day subcutaneous Betaseron was given.  5. Acute kidney injury secondary to acute tubular necrosis in the setting of septic shock, resolved after IV hydration.  6. Urinary tract infection secondary to chronic Foley, treated with Levaquin. Urine culture grew Proteus. Urology consultation was obtained with Dr. Trey Paula, and  the patient did have a Foley replaced while in house.  7. Depression, stable. The patient is on Lexapro.  8. Chronic anticoagulation due to history of pulmonary embolus and deep vein thrombosis. The patient's INR was supratherapeutic at admission. Coumadin was held, now resumed prior to discharge.  She is back on her home dose of 4.5 daily. INR will be checked until her INR is greater than 2 at the skilled facility.  9. Hypomagnesemia, started on p.o. replacement.  Hospital stay otherwise remained stable.   CODE STATUS: The patient remained a full code.   LABORATORIES AT DISCHARGE: PT/INR is 18.6 and 1.6. Glucose is 77, BUN 6, creatinine 0.35, sodium 140, potassium is 3.7, chloride is 109, bicarbonate is 27. Hemoglobin is 11.6, hematocrit is 33.6, platelet count is 116, white count is 8.6. Blood cultures grew Proteus mirabilis and urine culture grew Enterococcus and Proteus mirabilis.   TIME SPENT: 40 minutes.   ____________________________ Wylie Hail Allena Katz, MD sap:OSi D: 10/04/2012 12:43:50 ET T: 10/04/2012 13:08:20 ET JOB#: [id number]  cc: Steen Bisig A. Allena Katz, MD, <Dictator> Glenetta Borg, MD Rosalyn Gess. Blocker, MD Caralyn Guile. Edwyna Shell, DO Willow Ora MD ELECTRONICALLY SIGNED 10/22/2012 15:19

## 2014-09-25 NOTE — Consult Note (Signed)
Impression:     54yo female w/ h/o multiple sclerosis and chronic foley catheter admitted with Proteus sepsis and foley related UTI.      She presented with fever and sepsis and has responded to fluoroquinolones.  She had two urine cultures on admission, each with two organisms present.  Only the Proteus was found in both urine cultures and the blood cultures. This is the likely pathogen causing her sepsis.    Would continue levofloxacin for 14 days.    Would change to po when able to take po.    If her foley has not been changed this hospitalization, would do so.  Electronic Signatures: Tuesday Terlecki MPH, Rosalyn Gess (MD)  (Signed on 01-May-14 21:16)  Authored  Last Updated: 01-May-14 21:16 by Latriece Anstine MPH, Rosalyn Gess (MD)

## 2014-09-25 NOTE — H&P (Signed)
PATIENT NAME:  Brittany Madden, WELDON MR#:  [id number] DATE OF BIRTH:  04/17/1961  DATE OF ADMISSION:  09/28/2012.  REFERRING PHYSICIAN:  Lisa Roca, MD.   PRIMARY CARE PHYSICIAN:  Inez Pilgrim, MD   CHIEF COMPLAINT:  Altered mental status.   HISTORY OF PRESENT ILLNESS:  The patient is a very nice 54 year old female with a history of multiple sclerosis diagnosed in 1996, depression, hyperlipidemia, history of previous drug abuse, a history of tracheostomy, a history of neuropathy, chronic pain and behavioral problems. She was brought via EMS from [facility] with a chief complaint of unresponsiveness. The patient has been really tachycardic and febrile. She was responding only to sternal rub, looked very dehydrated with a blood pressure of 76/51. Her heart rate at that time was 144. She was never hypoxic. Her oxygen saturation was 93, although she has really thick secretions in her upper respiratory airway. The patient was given 2 L of fluids and her blood pressure improved to 135/70s and right now she is on her third liter of fluid due to her severe tachycardia. The patient has a heart rate coming down in the 140s, 130s now. The patient overall is starting to perk up. She is able to open her eyes. She looks awake. Her cousin is right there. Dr. Reita Cliche had a long conversation with the brother, who is the power of attorney, and overall SHE IS A FULL CODE. She is very difficult to understand because every time that she talks, she does it on a whisper although right now she is not communicating due to her altered mental status. The patient is admitted for continuation of treatment. She has a UTI, her lungs sound congested, but we are unable to get a chest x-ray due to her multiple contractures for what we are going to order a CT scan for further evaluation.   REVIEW OF SYSTEMS:  Unable to obtain a review of systems due to patient's altered mental status and inability to communicate. As far as we know the patient  is being in her normal state of health up until the last 24 hours when she became unresponsive. Now she is little bit more perky but unable to communicate and the cousin who is here does not have any further information about review of systems.   PAST MEDICAL HISTORY:  1.  Multiple sclerosis diagnosed in 1996.  2.  Hyperlipidemia.  3.  Depression.  4.  Neuropathy, peripheral.  5.  Restless leg syndrome.  6.  GERD.  7.  Constipation.  8.  Hypertension.  9.  A history of previous cocaine abuse.  10.  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  11.  Chronic allergic rhinitis.  12.  Dysphagia.  13.  Menometrorrhagia.  14.  Quadriplegia secondary to multiple sclerosis.   ALLERGIES:  MULTIPLE INCLUDING ASPIRIN, KEFLEX, NONSTEROIDAL ANTI-INFLAMMATORIES AND SEAFOOD.   PAST SURGICAL HISTORY: 1.  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  2.  Unknown any other surgeries.   FAMILY HISTORY:  Positive for CVA in several members of her maternal family. Mom and Dad had hypertension. Positive for lung cancer in her dad who was a smoker.   SOCIAL HISTORY:  The patient is divorced. She lives in [place]. She has no kids. Her power of attorney is her brother. She does not smoke, does not drink. She used to have a drug abuse problem but not anymore.   MEDICATIONS:  1.  Triamcinolone cream  2.  Jantoven 4.5 mg at bedtime.  3.  Juven protein powder.  4.  Nizoral shampoo.  5.  Baclofen 10 mg take 1/2 tablet 3 times daily. 6.  Tylenol Extra Strength as needed.  7.  Norco 5/325 mg every 6 hours as needed for pain. 8.  Lipitor 20 mg at bedtime. 9.  Betaseron 0.3 mg every other day. 10.  Omeprazole 20 mg once daily. 11.  Zanaflex 4 mg, take 2 tablets 4 times a day. 12.  Metoprolol succinate 50 mg once a day. 13.  Multivitamins once a day. 14.  Vitamin D3, 5000 units once a month. 15.  Phenergan p.r.n.  16.  Lexapro 10 mg once daily. 17.  MiraLAX powder as needed for  constipation.  18.  Neurontin 600 mg twice daily.  19.  Depo-Provera 150 mg every 3 months. 20.  Citracal-D 2 tablets twice daily.   PHYSICAL EXAMINATION:  VITAL SIGNS:  Blood pressure 76/51, pulse 144, temperature 101.7, oxygen saturation 93% on 2 L nasal cannula. The patient is alert but not communicative. She looks critically ill and chronically ill at the same time. She looks severely dehydrated.  HEENT:  Her pupils are equal and reactive. Extraocular movements seem intact. The patient is able to track down. Anicteric sclerae. Pink conjunctivae. Oral mucosa is very dry. She has geographic tongue/a strawberry tongue. No oral lesions. No oropharyngeal exudates.  NECK:  Supple. No JVD. No thyromegaly. No adenopathy. No carotid bruits. No rigidity. No masses.  CARDIOVASCULAR:  Regular rate and rhythm, tachycardic in the 140s. No displacement of PMI. No murmurs, rubs or gallops are appreciated.  LUNGS:  Slightly congested on upper respiratory airway with increased secretions and there are some rales in both bases, the right more than left. Unable to get an x-ray due to contractions. Negative use of accessory muscles. No dullness to percussion.  ABDOMEN:  Soft, nontender, nondistended. No hepatosplenomegaly. No masses. Bowel sounds are positive.  GENITAL:  Negative for external lesions. Foley catheter has been exchanged. There is some blood in very concentrated urine.  EXTREMITIES:  Positive contractions. No edema, cyanosis, or clubbing. Pulses +2. Capillary refill less than 3. Decubitus ulcer on the right cheek with some secretions. They are odorous but not purulent.  PSYCHIATRIC:  Mood:  The patient is slightly lethargic, more awake now, but flat affect.  NEUROLOGIC:  Cranial nerves unable to evaluate due to lethargy, but the patient is quadriplegic, does not move any of her extremities other than her arm a couple of centimeters down. At this moment, she is very contracted. Sensation is unobtainable  due to quadriplegia. No meningeal signs. No rigidity.  LYMPHATICS:  Negative for lymphadenopathy in neck or supraclavicular areas.  SKIN:  No rashes or petechiae. There are decubitus ulcers as mentioned above. There is a little excoriation of the left pretibial area of 2 cm, healing.  MUSCULOSKELETAL:  Multiple contractions in several extremities. Her legs seem to be stretched, but her arms are very contracted.   LABORATORY, DIAGNOSTIC AND RADIOLOGICAL DATA:  Glucose 139, BUN 33, creatinine 1.41. GFR is around 40. Total protein 8.7, albumin 2.9. AST is 55. White cells are 4.3; hemoglobin is 16. UA:  141 red blood cells, 48 white blood cells, nitrate negative, leukocyte esterase +1. Chest x-ray:  Unable to obtain. We are going to get a CT scan. EKG:  Sinus tachycardia.   ASSESSMENT AND PLAN:  A 54 year old female with history of multiple medical problems including multiple sclerosis, depression, hyperlipidemia, neuropathy, chronic  pain, restless leg syndrome, gastroesophageal reflux disease, hypertension, osteoporosis, constipation, comes with altered mental status.  1.  Altered mental status. Encephalopathy likely metabolic due to infection. At this moment it is starting to clear up after IV fluids given. Other factors involved on the altered mental status include severe dehydration and hypotension.  2.  Septic shock. The patient presented with systemic inflammatory response syndrome with fever of 101.4, tachycardia in the 140s, tachypnea, altered mental status. At this moment, her blood pressure has been dropping in the 170s. It required up to 3 liters to improve. Septic shock criteria is met. The patient right now has improved of blood pressures after 3 liters of fluid. The patient does not need to go to the Critical Care Unit, but we are going to keep her well-monitored under telemetry especially since she is still very tachycardic. Continue IV fluids. 3.  Broad-spectrum antibiotics with Levaquin. The  patient has multiple allergies. At this moment, I think the most likely infectious process is urinary tract infection, but I do not have an evidence of patient's chest x-ray since we were not able to get one. I am going to get a CT scan to rule out pneumonia and aspiration since the patient has large secretions. She has not been hypoxic, but she is always been on 2 liters nasal cannula and her O2 sats were on the low 90s. We will continue evaluation.  4.  Multiple sclerosis. Continue treatment symptomatic and with her injections as well.  5.  Acute kidney injury due to acute tubular necrosis due to infection as well as hypotension and dehydration. We are going to give IV fluids and continue evaluation and management and follow up in the morning.  6.  Urinary tract infection. Treated with levofloxacin.  7.  Gastroesophageal reflux disease. Continue omeprazole.  8.  Depression. At this moment, it is stable.  9.  Other medical problems seem to be stable.  10. Deep venous thrombosis prophylaxis with heparin.   CODE STATUS:  THE PATIENT IS A FULL CODE.  Critical care time as the patient has significant risk factors for deterioration, septic shock with severe hypotension at this moment has resolved, but there is still a potential for decompensation.   CRITICAL CARE TIME: 50 minutes   ____________________________ Alturas Sink, MD rsg:jm D: 09/28/2012 11:26:57 ET T: 09/28/2012 12:06:43 ET JOB#: [id number]  cc: Parks Sink, MD, <Dictator> Dewitt Judice America Brown MD ELECTRONICALLY SIGNED 10/05/2012 11:11

## 2014-09-25 NOTE — Consult Note (Signed)
PATIENT NAME:  Brittany Madden, Brittany Madden MR#:  [id number] DATE OF BIRTH:  Feb 08, 1961  DATE OF CONSULTATION:  10/03/2012  REFERRING PHYSICIAN:  Halbur Sink, MD   CONSULTING PHYSICIAN:  Heinz Knuckles. Zyniah Ferraiolo, MD  REASON FOR CONSULT: Sepsis.   HISTORY OF PRESENT ILLNESS: The patient is a 54 year old female with a past history significant for multiple sclerosis and chronic Foley catheter placement, who was admitted on April 26th with mental status changes and hypotension with a blood pressure of 76/51 with tachycardia. She does not recall any of the events leading up to her hospitalization and was not able to provide significant history. Per the history and physical, she was given fluid resuscitation with improvement in her blood pressure. She was started empirically on levofloxacin. Her blood cultures grew Proteus species, and she had 2 urine cultures on admission, 1 of which grew E.  coli and Proteus, the other one grew enterococcus and Proteus. She had a wound culture which grew multiple organisms from a pressure ulcer. Her white count on admission was 4.3 but went up to 34.6 the next day and has steadily come back down to normal. She was initially febrile and has defervesced on therapy. Currently, she is without significant complaint and feels that her mentation is back to baseline. She is a poor historian partly due to her mental status changes and partly due to difficulty in speaking.   ALLERGIES: ASPIRIN, KEFLEX, NSAIDS AND SEAFOOD.  PAST MEDICAL HISTORY: 1.  Multiple sclerosis. The patient is bedbound.  2.  Chronic Foley catheter placement.  3.  Hypercholesterolemia.  4.  Depression.  5.  Peripheral neuropathy.  6.  Restless leg syndrome.  7.  GERD.  8.  Chronic constipation.  9.  Hypertension.  10.  Previous cocaine abuse.  11.  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  12.  Status post tracheostomy in the past, but this has been reversed.   SOCIAL HISTORY: The patient lives at [facility]. She does not smoke. She does not drink. She has a prior cocaine abuse issue but not currently.   FAMILY HISTORY: Positive for stroke, hypertension and lung cancer.   REVIEW OF SYSTEMS:  Unable to obtain from the patient as she does not have much memory of what was going on prior to her admission. She currently does not complain of any pain or shortness of breath but was a relatively poor historian.   PHYSICAL EXAMINATION: VITAL SIGNS: T-max of 101.5, T-current of 98.8, pulse of 97, blood pressure 104/69, 98% on room air.  GENERAL: A 54 year old cachectic white female lying in bed in no acute distress.  HEENT: Normocephalic, atraumatic. Pupils are equal, reactive to light. Extraocular motion intact. Sclerae, conjunctivae, and lids are without evidence for emboli or petechiae. Oropharynx shows no erythema or exudate. Gums are in fair condition.  She was extremely hoarse and had had difficulty with vocalization.  NECK: Midline trachea. No lymphadenopathy. No thyromegaly.  LUNGS: Clear to auscultation bilaterally with good air movement. No focal consolidation.  HEART: Regular rate and rhythm without murmur, rub or gallop.  ABDOMEN: Soft, nontender and nondistended. No hepatosplenomegaly. No hernia is noted. No CVA tenderness.  EXTREMITIES: She had muscle wasting  but no acute evidence for tenosynovitis.  SKIN: No rashes. No stigmata of endocarditis, specifically, no Janeway lesions or Osler nodes.  Her sacral pressure ulcer was not directly observed.  NEUROLOGIC: The patient was awake and pleasant but did not appear to have any recollection of the events leading up to  her hospitalization.  PSYCHIATRIC: Mood and affect appeared normal.   LABORATORY AND RADIOLOGICAL DATA:  BUN of 6, creatinine 0.25, bicarbonate 27, anion gap of 4, AST 54, ALT 42, alk phos 91, total bilirubin 0.4. White count of 8.6 with a hemoglobin of 11.6, platelet count of 116, ANC of 6.1. Her white count on admission was  4.3 but went up to 34.6 the day following admission.  Urinalysis from admission had negative nitrites, 1+ leukocyte esterase, 141 red cells, 48 white cells. Two urine cultures were obtained on admission, 1 is growing E.  coli and Proteus mirabilis, the other is growing enterococcus and Proteus mirabilis. Blood cultures on admission were positive for Proteus mirabilis. A wound culture had multiple organisms present.   A CT scan of the chest without contrast demonstrated bibasilar mild airspace disease thought to possibly be atelectasis.   IMPRESSION: A 54 year old female with a past history significant for multiple sclerosis and chronic Foley catheter placement who is admitted with Proteus sepsis and Foley-related urinary tract infection.   RECOMMENDATIONS:  1.  She presented with fever and sepsis and responded to fluoroquinolones. She had 2 urine cultures on admission, each with 2 organisms present. Only the Proteus was found in both urine cultures and the blood cultures. This is likely pathogen causing her sepsis.  2.  I would continue the levofloxacin for 14 days.  3.  I would change to p.o. when able to take p.o.  4.  If  her Foley has not been changed this hospitalization, I would do so.  5.  I would not treat the multiple organisms in the superficial wound culture as this is likely just sampling what is on the surface.   This is a moderately complex infectious disease case. Thank you very much for involving me in this patient's care.   ____________________________ Heinz Knuckles. Leonce Bale, MD meb:cb D: 10/03/2012 21:24:38 ET T: 10/03/2012 22:45:00 ET JOB#: [id number]  cc: Heinz Knuckles. Vandell Kun, MD, <Dictator> Kiauna Zywicki E Cambria Osten MD ELECTRONICALLY SIGNED 10/07/2012 10:07

## 2014-10-05 ENCOUNTER — Other Ambulatory Visit
Admission: RE | Admit: 2014-10-05 | Discharge: 2014-10-05 | Disposition: A | Discharge status: Home / Self Care | Payer: Medicaid Other | Source: Skilled Nursing Facility | Attending: Family Medicine | Admitting: Family Medicine

## 2014-10-05 DIAGNOSIS — R05 Cough: Secondary | ICD-10-CM | POA: Insufficient documentation

## 2014-10-05 DIAGNOSIS — R509 Fever, unspecified: Secondary | ICD-10-CM | POA: Diagnosis not present

## 2014-10-05 LAB — CBC WITH DIFFERENTIAL/PLATELET
Basophils Absolute: 0.1 10*3/uL (ref 0–0.1)
Basophils Relative: 0 %
Eosinophils Absolute: 0.3 10*3/uL (ref 0–0.7)
Eosinophils Relative: 2 %
HCT: 41.8 % (ref 35.0–47.0)
Hemoglobin: 13.2 g/dL (ref 12.0–16.0)
LYMPHS ABS: 0.9 10*3/uL — AB (ref 1.0–3.6)
Lymphocytes Relative: 6 %
MCH: 26.1 pg (ref 26.0–34.0)
MCHC: 31.6 g/dL — ABNORMAL LOW (ref 32.0–36.0)
MCV: 82.5 fL (ref 80.0–100.0)
MONOS PCT: 7 %
Monocytes Absolute: 1 10*3/uL — ABNORMAL HIGH (ref 0.2–0.9)
NEUTROS PCT: 85 %
Neutro Abs: 12.7 10*3/uL — ABNORMAL HIGH (ref 1.4–6.5)
Platelets: 226 10*3/uL (ref 150–440)
RBC: 5.07 MIL/uL (ref 3.80–5.20)
RDW: 16.1 % — AB (ref 11.5–14.5)
WBC: 15 10*3/uL — ABNORMAL HIGH (ref 3.6–11.0)

## 2014-10-05 LAB — BASIC METABOLIC PANEL
Anion gap: 8 (ref 5–15)
BUN: 10 mg/dL (ref 6–20)
CALCIUM: 9 mg/dL (ref 8.9–10.3)
CO2: 26 mmol/L (ref 22–32)
Chloride: 101 mmol/L (ref 101–111)
Glucose, Bld: 87 mg/dL (ref 65–99)
POTASSIUM: 4.4 mmol/L (ref 3.5–5.1)
SODIUM: 135 mmol/L (ref 135–145)

## 2014-10-06 ENCOUNTER — Other Ambulatory Visit
Admission: RE | Admit: 2014-10-06 | Discharge: 2014-10-06 | Disposition: A | Discharge status: Home / Self Care | Payer: Medicaid Other | Source: Skilled Nursing Facility | Attending: Family Medicine | Admitting: Family Medicine

## 2014-10-06 DIAGNOSIS — R509 Fever, unspecified: Secondary | ICD-10-CM | POA: Diagnosis not present

## 2014-10-06 LAB — URINALYSIS COMPLETE WITH MICROSCOPIC (ARMC ONLY)
Bilirubin Urine: NEGATIVE
Glucose, UA: NEGATIVE mg/dL
Ketones, ur: NEGATIVE mg/dL
NITRITE: NEGATIVE
PROTEIN: NEGATIVE mg/dL
SPECIFIC GRAVITY, URINE: 1.008 (ref 1.005–1.030)
Squamous Epithelial / LPF: NONE SEEN
pH: 5 (ref 5.0–8.0)

## 2014-10-08 LAB — URINE CULTURE: Culture: 1000

## 2015-01-02 ENCOUNTER — Inpatient Hospital Stay
Admission: EM | Admit: 2015-01-02 | Discharge: 2015-01-09 | DRG: 749 | Payer: Medicare Other | Attending: Internal Medicine | Admitting: Internal Medicine

## 2015-01-02 DIAGNOSIS — D689 Coagulation defect, unspecified: Secondary | ICD-10-CM | POA: Diagnosis present

## 2015-01-02 DIAGNOSIS — S3141XA Laceration without foreign body of vagina and vulva, initial encounter: Secondary | ICD-10-CM | POA: Diagnosis present

## 2015-01-02 DIAGNOSIS — K922 Gastrointestinal hemorrhage, unspecified: Secondary | ICD-10-CM | POA: Diagnosis present

## 2015-01-02 DIAGNOSIS — E871 Hypo-osmolality and hyponatremia: Secondary | ICD-10-CM | POA: Diagnosis present

## 2015-01-02 DIAGNOSIS — Z7901 Long term (current) use of anticoagulants: Secondary | ICD-10-CM

## 2015-01-02 DIAGNOSIS — D649 Anemia, unspecified: Secondary | ICD-10-CM | POA: Diagnosis present

## 2015-01-02 DIAGNOSIS — E876 Hypokalemia: Secondary | ICD-10-CM | POA: Diagnosis present

## 2015-01-02 DIAGNOSIS — G35 Multiple sclerosis: Secondary | ICD-10-CM | POA: Diagnosis present

## 2015-01-02 DIAGNOSIS — X58XXXA Exposure to other specified factors, initial encounter: Secondary | ICD-10-CM | POA: Diagnosis present

## 2015-01-02 DIAGNOSIS — L8990 Pressure ulcer of unspecified site, unspecified stage: Secondary | ICD-10-CM | POA: Insufficient documentation

## 2015-01-02 DIAGNOSIS — R000 Tachycardia, unspecified: Secondary | ICD-10-CM | POA: Diagnosis present

## 2015-01-02 DIAGNOSIS — N939 Abnormal uterine and vaginal bleeding, unspecified: Secondary | ICD-10-CM | POA: Diagnosis not present

## 2015-01-02 DIAGNOSIS — G8250 Quadriplegia, unspecified: Secondary | ICD-10-CM | POA: Diagnosis present

## 2015-01-02 DIAGNOSIS — Z66 Do not resuscitate: Secondary | ICD-10-CM | POA: Diagnosis present

## 2015-01-02 DIAGNOSIS — R64 Cachexia: Secondary | ICD-10-CM | POA: Diagnosis present

## 2015-01-02 DIAGNOSIS — M810 Age-related osteoporosis without current pathological fracture: Secondary | ICD-10-CM | POA: Diagnosis present

## 2015-01-02 DIAGNOSIS — R571 Hypovolemic shock: Secondary | ICD-10-CM | POA: Diagnosis present

## 2015-01-02 DIAGNOSIS — Z86711 Personal history of pulmonary embolism: Secondary | ICD-10-CM | POA: Diagnosis present

## 2015-01-02 DIAGNOSIS — L89153 Pressure ulcer of sacral region, stage 3: Secondary | ICD-10-CM | POA: Diagnosis present

## 2015-01-02 DIAGNOSIS — Z86718 Personal history of other venous thrombosis and embolism: Secondary | ICD-10-CM

## 2015-01-02 HISTORY — DX: Depression, unspecified: F32.A

## 2015-01-02 HISTORY — DX: Neuromuscular dysfunction of bladder, unspecified: N31.9

## 2015-01-02 HISTORY — DX: Other pulmonary embolism without acute cor pulmonale: I26.99

## 2015-01-02 HISTORY — DX: Acute embolism and thrombosis of unspecified deep veins of unspecified lower extremity: I82.409

## 2015-01-02 HISTORY — DX: Multiple sclerosis: G35

## 2015-01-02 HISTORY — DX: Major depressive disorder, single episode, unspecified: F32.9

## 2015-01-02 HISTORY — DX: Functional quadriplegia: R53.2

## 2015-01-02 LAB — BASIC METABOLIC PANEL
ANION GAP: 7 (ref 5–15)
BUN: 13 mg/dL (ref 6–20)
CALCIUM: 9 mg/dL (ref 8.9–10.3)
CO2: 22 mmol/L (ref 22–32)
Chloride: 106 mmol/L (ref 101–111)
Creatinine, Ser: <0.3 mg/dL — ABNORMAL LOW (ref 0.44–1.00)
GLUCOSE: 100 mg/dL — AB (ref 65–99)
Potassium: 4.4 mmol/L (ref 3.5–5.1)
Sodium: 135 mmol/L (ref 135–145)

## 2015-01-02 LAB — CBC WITH DIFFERENTIAL/PLATELET
Basophils Absolute: 0.1 10*3/uL (ref 0–0.1)
Basophils Relative: 1 %
Eosinophils Absolute: 0.4 10*3/uL (ref 0–0.7)
Eosinophils Relative: 4 %
HCT: 38.3 % (ref 35.0–47.0)
Hemoglobin: 12.6 g/dL (ref 12.0–16.0)
Lymphocytes Relative: 31 %
Lymphs Abs: 2.8 10*3/uL (ref 1.0–3.6)
MCH: 27.1 pg (ref 26.0–34.0)
MCHC: 32.8 g/dL (ref 32.0–36.0)
MCV: 82.5 fL (ref 80.0–100.0)
Monocytes Absolute: 0.5 10*3/uL (ref 0.2–0.9)
Monocytes Relative: 6 %
Neutro Abs: 5.3 10*3/uL (ref 1.4–6.5)
Neutrophils Relative %: 58 %
Platelets: 250 10*3/uL (ref 150–440)
RBC: 4.64 MIL/uL (ref 3.80–5.20)
RDW: 14.8 % — ABNORMAL HIGH (ref 11.5–14.5)
WBC: 9.2 10*3/uL (ref 3.6–11.0)

## 2015-01-02 LAB — PROTIME-INR
INR: 2.63
Prothrombin Time: 28.2 seconds — ABNORMAL HIGH (ref 11.4–15.0)

## 2015-01-02 MED ORDER — SODIUM CHLORIDE 0.9 % IV BOLUS (SEPSIS)
1000.0000 mL | Freq: Once | INTRAVENOUS | Status: AC
  Administered 2015-01-02: 1000 mL via INTRAVENOUS

## 2015-01-02 MED ORDER — SODIUM CHLORIDE 0.9 % IV BOLUS (SEPSIS)
1000.0000 mL | Freq: Once | INTRAVENOUS | Status: AC
Start: 2015-01-02 — End: 2015-01-03
  Administered 2015-01-02: 1000 mL via INTRAVENOUS

## 2015-01-02 NOTE — ED Notes (Signed)
Pt to ED via EMS from [facility] c/o blood in diaper.  Per EMS when pt checked by staff noted bright red blood in diaper.  Pt presents pale, diaphoretic, large amount of bright red blood in diaper and gown.  Pt has DNR paperwork.

## 2015-01-02 NOTE — ED Provider Notes (Signed)
[facility] Emergency Department Provider Note   ____________________________________________  Time seen: 2310  I have reviewed the triage vital signs and the nursing notes.   HISTORY  Chief Complaint GI Bleeding   History limited by: Non verbal   HPI Brittany Madden is a 54 y.o. female with history of MS who presents to the emergency department today because of concerns for possible GI bleeding. The patient was found to have blood in her diaper today. The patient is a resident of [place]. The patient herself is nonverbal. When asked our she denied being in any pain.    No past medical history on file.  There are no active problems to display for this patient.   No past surgical history on file.  No current outpatient prescriptions on file.  Allergies Review of patient's allergies indicates not on file.  No family history on file.  Social History History  Substance Use Topics  . Smoking status: Not on file  . Smokeless tobacco: Not on file  . Alcohol Use: Not on file  Lives at [facility]  Review of Systems Unable to obtain secondary to pt non verbal ____________________________________________   PHYSICAL EXAM:  VITAL SIGNS: ED Triage Vitals  Enc Vitals Group     BP 01/02/15 2255 107/85 mmHg     Pulse Rate 01/02/15 2255 62     Resp 01/02/15 2255 18     Temp 01/02/15 2255 99.1 F (37.3 C)     Temp Source 01/02/15 2255 Axillary     SpO2 01/02/15 2255 99 %     Weight 01/02/15 2255 110 lb (49.896 kg)     Height 01/02/15 2255  (1.651 m)   Constitutional: Awake and alert Eyes: Conjunctivae are normal. PERRL. Normal extraocular movements. ENT   Head: Normocephalic and atraumatic.   Nose: No congestion/rhinnorhea.   Mouth/Throat: Mucous membranes are moist.   Neck: No stridor. Hematological/Lymphatic/Immunilogical: No cervical lymphadenopathy. Cardiovascular: Tachycardic, regular rhythm.  No murmurs, rubs, or  gallops. Respiratory: Normal respiratory effort without tachypnea nor retractions. Breath sounds are clear and equal bilaterally. No wheezes/rales/rhonchi. Gastrointestinal: Soft and nontender. No distention.  Genitourinary: Bleeding from vulva.  Musculoskeletal: Very minimal movement of extremities.  Neurologic:  Minimal movement. Non verbal. Awake and alert.  Skin:  Skin is warm, dry and intact. No rash noted.   ____________________________________________    LABS (pertinent positives/negatives)  Labs Reviewed  CBC WITH DIFFERENTIAL/PLATELET - Abnormal; Notable for the following:    RDW 14.8 (*)    All other components within normal limits  PROTIME-INR - Abnormal; Notable for the following:    Prothrombin Time 28.2 (*)    All other components within normal limits  BASIC METABOLIC PANEL - Abnormal; Notable for the following:    Glucose, Bld 100 (*)    Creatinine, Ser <0.30 (*)    All other components within normal limits  HEPATIC FUNCTION PANEL - Abnormal; Notable for the following:    Albumin 3.3 (*)    Total Bilirubin 0.2 (*)    Bilirubin, Direct <0.1 (*)    All other components within normal limits  FIBRINOGEN - Abnormal; Notable for the following:    Fibrinogen 612 (*)    All other components within normal limits  TYPE AND SCREEN  PREPARE RBC (CROSSMATCH)  ABO/RH     ____________________________________________   EKG  None  ____________________________________________    RADIOLOGY  None  ____________________________________________   PROCEDURES  Procedure(s) performed: None  Critical Care performed: No  ____________________________________________   INITIAL IMPRESSION / ASSESSMENT AND PLAN / ED COURSE  Pertinent labs & imaging results that were available during my care of the patient were reviewed by me and considered in my medical decision making (see chart for details).  Patient presents with concern initially for possible GI bleed however  on exam appears that the bleeding is coming from the vulva. An ultrasound was obtained and OB/GYN was consulted. Patient initially was tachycardic with concerns for worsening blood pressure. She did appear to respond well to fluids and the tachycardia resolved. The patient was seen by OB/GYN who will admit for exam under anesthesia.  ____________________________________________   FINAL CLINICAL IMPRESSION(S) / ED DIAGNOSES  Final diagnoses:  Vaginal bleeding     Phineas Semen, MD 01/03/15 [phone]

## 2015-01-02 NOTE — ED Notes (Signed)
Pt cleansed of large amount of blood saturation of brief and 2 pads beneath pt. Pt's posterior hospital gown saturated with bright red blood that appears to be coming from vagina. Pt placed on oxygen at 2lpm via [place] on arrival to ed. Assisted md with pelvic exam. When md placed speculum in vagina a large amount of bright red blood watery in nature from vagina. Pt cleansed of stool, soft and brown. Pt with tunneling ulcer noted to sacrum, stephen RN aware of ulcer.

## 2015-01-03 ENCOUNTER — Encounter
Admission: EM | Disposition: A | Discharge status: Other Acute Inpatient Hospital | Payer: Self-pay | Source: Home / Self Care | Attending: Internal Medicine

## 2015-01-03 ENCOUNTER — Emergency Department: Payer: Medicare Other

## 2015-01-03 ENCOUNTER — Observation Stay: Payer: Medicare Other | Admitting: Registered Nurse

## 2015-01-03 ENCOUNTER — Encounter: Payer: Self-pay | Admitting: *Deleted

## 2015-01-03 DIAGNOSIS — Z7901 Long term (current) use of anticoagulants: Secondary | ICD-10-CM

## 2015-01-03 DIAGNOSIS — Z86718 Personal history of other venous thrombosis and embolism: Secondary | ICD-10-CM

## 2015-01-03 DIAGNOSIS — Z86711 Personal history of pulmonary embolism: Secondary | ICD-10-CM | POA: Diagnosis present

## 2015-01-03 DIAGNOSIS — N939 Abnormal uterine and vaginal bleeding, unspecified: Secondary | ICD-10-CM | POA: Diagnosis present

## 2015-01-03 HISTORY — PX: EXAMINATION UNDER ANESTHESIA: SHX1540

## 2015-01-03 LAB — PROTIME-INR
INR: 2.98
Prothrombin Time: 31 seconds — ABNORMAL HIGH (ref 11.4–15.0)

## 2015-01-03 LAB — CBC
HEMATOCRIT: 21.3 % — AB (ref 35.0–47.0)
HEMOGLOBIN: 7 g/dL — AB (ref 12.0–16.0)
MCH: 27.1 pg (ref 26.0–34.0)
MCHC: 33 g/dL (ref 32.0–36.0)
MCV: 82.1 fL (ref 80.0–100.0)
Platelets: 166 10*3/uL (ref 150–440)
RBC: 2.6 MIL/uL — ABNORMAL LOW (ref 3.80–5.20)
RDW: 14.5 % (ref 11.5–14.5)
WBC: 10.5 10*3/uL (ref 3.6–11.0)

## 2015-01-03 LAB — HEPATIC FUNCTION PANEL
ALT: 29 U/L (ref 14–54)
AST: 32 U/L (ref 15–41)
Albumin: 3.3 g/dL — ABNORMAL LOW (ref 3.5–5.0)
Alkaline Phosphatase: 78 U/L (ref 38–126)
BILIRUBIN TOTAL: 0.2 mg/dL — AB (ref 0.3–1.2)
TOTAL PROTEIN: 7.8 g/dL (ref 6.5–8.1)

## 2015-01-03 LAB — PREPARE RBC (CROSSMATCH)

## 2015-01-03 LAB — FIBRINOGEN: Fibrinogen: 612 mg/dL — ABNORMAL HIGH (ref 210–470)

## 2015-01-03 LAB — ABO/RH: ABO/RH(D): O POS

## 2015-01-03 SURGERY — Surgical Case
Anesthesia: *Unknown

## 2015-01-03 SURGERY — EXAM UNDER ANESTHESIA
Anesthesia: Monitor Anesthesia Care | Laterality: Right | Wound class: Clean Contaminated

## 2015-01-03 MED ORDER — FERRIC SUBSULFATE 259 MG/GM EX SOLN
CUTANEOUS | Status: DC | PRN
  Administered 2015-01-03: 1 via TOPICAL

## 2015-01-03 MED ORDER — CHLORHEXIDINE GLUCONATE 0.12 % MT SOLN
15.0000 mL | Freq: Two times a day (BID) | OROMUCOSAL | Status: DC
  Administered 2015-01-04 – 2015-01-09 (×11): 15 mL via OROMUCOSAL

## 2015-01-03 MED ORDER — LACTATED RINGERS IV SOLN
INTRAVENOUS | Status: DC | PRN
  Administered 2015-01-03: 12:00:00 via INTRAVENOUS

## 2015-01-03 MED ORDER — POTASSIUM CHLORIDE IN NACL 20-0.9 MEQ/L-% IV SOLN
INTRAVENOUS | Status: DC
  Administered 2015-01-03 – 2015-01-08 (×8): via INTRAVENOUS
  Filled 2015-01-03 (×10): qty 1000

## 2015-01-03 MED ORDER — FENTANYL CITRATE (PF) 100 MCG/2ML IJ SOLN: 25.0000 ug | INTRAMUSCULAR | Status: DC | PRN

## 2015-01-03 MED ORDER — OXYCODONE-ACETAMINOPHEN 5-325 MG PO TABS: 1.0000 | ORAL_TABLET | ORAL | Status: DC | PRN

## 2015-01-03 MED ORDER — METHYLENE BLUE 1 % INJ SOLN
INTRAMUSCULAR | Status: AC
  Filled 2015-01-03: qty 10

## 2015-01-03 MED ORDER — CETYLPYRIDINIUM CHLORIDE 0.05 % MT LIQD
7.0000 mL | Freq: Two times a day (BID) | OROMUCOSAL | Status: DC
Start: 2015-01-03 — End: 2015-01-09
  Administered 2015-01-04 – 2015-01-08 (×10): 7 mL via OROMUCOSAL

## 2015-01-03 MED ORDER — FERRIC SUBSULFATE 259 MG/GM EX SOLN
CUTANEOUS | Status: AC
  Filled 2015-01-03: qty 8

## 2015-01-03 MED ORDER — FENTANYL CITRATE (PF) 100 MCG/2ML IJ SOLN
25.0000 ug | INTRAMUSCULAR | Status: DC | PRN
Start: 2015-01-03 — End: 2015-01-03

## 2015-01-03 MED ORDER — PROPOFOL INFUSION 10 MG/ML OPTIME
INTRAVENOUS | Status: DC | PRN
  Administered 2015-01-03: 100 ug/kg/min via INTRAVENOUS

## 2015-01-03 MED ORDER — ONDANSETRON HCL 4 MG/2ML IJ SOLN: 4.0000 mg | Freq: Once | INTRAMUSCULAR | Status: AC | PRN

## 2015-01-03 SURGICAL SUPPLY — 21 items
CUP MEDICINE 2OZ PLAST GRAD ST (MISCELLANEOUS) ×3 IMPLANT
DRAPE UNDER BUTTOCK W/FLU (DRAPES) ×3 IMPLANT
GLOVE BIO SURGEON STRL SZ 6.5 (GLOVE) ×5 IMPLANT
GLOVE BIO SURGEONS STRL SZ 6.5 (GLOVE) ×4
GOWN STRL REUS W/ TWL LRG LVL3 (GOWN DISPOSABLE) ×1 IMPLANT
GOWN STRL REUS W/TWL LRG LVL3 (GOWN DISPOSABLE) ×6
JELLY LUB 2OZ STRL (MISCELLANEOUS) ×2
JELLY LUBE 2OZ STRL (MISCELLANEOUS) ×1 IMPLANT
KIT RM TURNOVER CYSTO AR (KITS) ×3 IMPLANT
LABEL OR SOLS (LABEL) ×1 IMPLANT
NS IRRIG 500ML POUR BTL (IV SOLUTION) ×3 IMPLANT
PACK DNC HYST (MISCELLANEOUS) ×3 IMPLANT
PAD OB MATERNITY 4.3X12.25 (PERSONAL CARE ITEMS) ×3 IMPLANT
PAD PREP 24X41 OB/GYN DISP (PERSONAL CARE ITEMS) ×3 IMPLANT
SOL PREP PVP 2OZ (MISCELLANEOUS) ×3
SOLUTION PREP PVP 2OZ (MISCELLANEOUS) ×1 IMPLANT
SPONGE XRAY 4X4 16PLY STRL (MISCELLANEOUS) ×3 IMPLANT
SUT VIC AB 3-0 SH 27 (SUTURE) ×6
SUT VIC AB 3-0 SH 27X BRD (SUTURE) IMPLANT
TOWEL OR 17X26 4PK STRL BLUE (TOWEL DISPOSABLE) ×3 IMPLANT
TRAY FOLEY W/METER SILVER 16FR (SET/KITS/TRAYS/PACK) ×2 IMPLANT

## 2015-01-03 NOTE — H&P (Signed)
Consult History and Physical   SERVICE: Gynecology   Patient Name: Brittany Madden Patient MRN:   [id number]  CC: vaginal bleeding  HPI: Brittany Madden is a 54 y.o.  Who presents to the ED from her living facility with concerns for a GI bleed, due to her having a large amount blood found on her diaper.  She is 10 with severe MS, and contractures of the extremities causing quadriplegia, and is essentially non-verbal (does say or mouth some words). Upon arrival she was tachycardic which resolved with fluid administration. She has not had a period in 2 years per her POA/brother who I was able to speak with briefly on the telephone (lives in [place]).  Initially this was thought to be a GI bleed, but on exam was negative for rectal blood, and positive for a vaginal source.  Due to her contractures, a bedside speculum exam was unable to be performed by ED staff.  An ultrasound was obtained that was unremarkable except for an endometrial echo of 5.1mm.    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  Last INR/PTT was 7/20 and 3.2 / 36.2.  Given via G-tube.  Patient denies pain.  Of note, patient presents with a sacral/right ischial decubitus ulcer, described as stage 3.  Review of Systems: positives in bold GEN:   fevers, chills, weight changes, appetite changes, fatigue, night sweats HEENT:  HA, vision changes, hearing loss, congestion, rhinorrhea, sinus pressure, dysphagia CV:   CP, palpitations PULM:  SOB, cough GI:  abd pain, N/V/D/C GU:  dysuria, urgency, frequency MSK:  arthralgias, myalgias, back pain, swelling SKIN:  rashes, color changes, pallor NEURO:  numbness, weakness, tingling, seizures, dizziness, tremors PSYCH:  depression, anxiety, behavioral problems, confusion  HEME/LYMPH:  easy bruising or bleeding ENDO:  heat/cold intolerance  Past Obstetrical History: OB History    No data available       Past Gynecologic History: No LMP recorded. Patient is postmenopausal.   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  Social History:  History   Social History  . Marital Status: Single    Spouse Name: N/A  . Number of Children: N/A  . Years of Education: N/A   Occupational History  . Not on file.   Social History Main Topics  . Smoking status: Not on file  . Smokeless tobacco: Not on file  . Alcohol Use: Not on file  . Drug Use: Not on file  . Sexual Activity: Not on file   Other Topics Concern  . Not on file   Social History Narrative  . No narrative on file    Home Medications:  Warfarin 6mg  MWF, 6.5mg  T/T/S/S Gabapentin 250/33ml soln - 600mg  TID Eldertonic Elixir 10ml before meals TID Twocal HN liquid, 237 bolus BID Klor-con M20 tablet Baclofen 20mg  tablet q6h Lactulose 20gm/42mL - 53mL hs Omeprazole DR 20mg  in AM Vitamin D3 50,000u qmonth Miralax 17g in 4-8oz liquid daily PRN constipation UTI-STAT liquid, 72mL daily Lexapro 5mg /13mL - 63mL daily Acetaminophen 650 suppository rectally q6h prn pain/fever Refresh Tear 0l5% eye drops, 1 drop both eyes BID Bisacodyl 10mg  Suppository rectally, daily, prn constipation Biotene mouth spray, q2h prn dry mouth  Allergies:  Allergies  Allergen Reactions  . Asa [Aspirin] Other (See Comments)  Reaction: unknown  . Keflex [Cephalexin] Other (See Comments)    Reaction: unknown   . Nsaids Other (See Comments)    Reaction: unknown   . Salicylates Other (See Comments)    Reaction: unknown  Also: Seafood  Physical Exam:  Temp:  [99.1 F (37.3 C)] 99.1 F (37.3 C) (07/30 2255) Pulse Rate:  [62-122] 103 (07/31 0430) Resp:  [15-21] 21 (07/31 0430) BP: (92-107)/(67-89) 94/78 mmHg  (07/31 0430) SpO2:  [96 %-100 %] 100 % (07/31 0430) Weight:  [49.896 kg (110 lb)] 49.896 kg (110 lb) (07/30 2255)   General Appearance:  no acute distress, alert and oriented x3, emaciated, contracted, immobile. Cardiovascular:  Normal S1/S2, regular rate and rhythm Pulmonary:  clear to auscultation, symmetric air entry, good air exchange, normal effort Abdomen:  Bowel sounds present, soft, nontender, nondistended, no abnormal masses, no epigastric pain Extremities:  no pedal edema, 2+ distal pulses, no tenderness, limited motion with manipulation Skin:  Decubitus ulcer on low back sacrum/ right ischium area Psychiatric:  Normal mood and affect, appropriate Pelvic:  NEFG, blood on external genitalia.  no vulvar masses or lesions, atrophic vaginal mucosa, unable to assess with speculum due to patient positioning and limited motion. Unable to perform bimanual exam or reach cervix with a digit.  Labs/Studies:   CBC and Coags:  Lab Results  Component Value Date   WBC 9.2 01/02/2015   NEUTOPHILPCT 58 01/02/2015   EOSPCT 4 01/02/2015   BASOPCT 1 01/02/2015   LYMPHOPCT 31 01/02/2015   HGB 12.6 01/02/2015   HCT 38.3 01/02/2015   MCV 82.5 01/02/2015   PLT 250 01/02/2015   INR 2.63 01/02/2015   CMP:  Lab Results  Component Value Date   NA 135 01/02/2015   K 4.4 01/02/2015   CL 106 01/02/2015   CO2 22 01/02/2015   BUN 13 01/02/2015   CREATININE <0.30* 01/02/2015   CREATININE <0.30* 10/05/2014   CREATININE 0.35* 10/03/2012   PROT 7.8 01/02/2015   BILITOT 0.2* 01/02/2015   BILIDIR <0.1* 01/02/2015   ALT 29 01/02/2015   AST 32 01/02/2015   ALKPHOS 78 01/02/2015  Per transfer documents, INR, TSH, Lipid panel, HbA1c, and CBC are scheduled lab draws and have been reviewed in the last 2 weeks to 6 months.  TVUS:   US Transvaginal Non-ob  01/03/2015   CLINICAL DATA:  Vaginal bleeding  EXAM: TRANSABDOMINAL AND TRANSVAGINAL ULTRASOUND OF PELVIS  TECHNIQUE: Both transabdominal and  transvaginal ultrasound examinations of the pelvis were performed. Transabdominal technique was performed for global imaging of the pelvis including uterus, ovaries, adnexal regions, and pelvic cul-de-sac. It was necessary to proceed with endovaginal exam following the transabdominal exam to visualize the ovaries.  COMPARISON:  None  FINDINGS: Uterus  Measurements: 6.9 x 3.5 x 4.0 cm. No fibroids or other mass visualized.  Endometrium  Thickness: 4.8 mm.  No focal abnormality visualized.  Right ovary  Measurements: 2.7 x 1.6 x 1.4 cm. Normal appearance/no adnexal mass.  Left ovary  Measurements: 3.8 x 3.1 x 3.2 cm. Normal appearance/no adnexal mass.  Other findings  No free fluid.  IMPRESSION: No significant abnormality   Electronically Signed   By: Ellery Plunk M.D.   On: 01/03/2015 02:57     Assessment / Plan:   VENDA DICE is a 54 y.o. who presents with vaginal bleeding  1. Since the patient is still having bleeding, albeit less, and I am unable to perform a bedside exam, I feel the patient should have an exam  under anesthesia to evaluate for a source of her bleeding.  Cervical, vaginal and uterine sources each have potential.  I have consented her for an EUA with possible biopsies, and she verbalizes "ok".  When I asked if she wanted me to do this now or later in the today, she responded "do it".   I spoke with her brother and POA, Brittany Madden via phone, and he also consented for the procedure.  This was verified independently by one of th ED nurses, Vergia Alcon, RN.  She is also consented for a blood transfusion if needed.  While I am unsure if I will be able to properly assess her even in the OR, under sedation, I am hopeful I may be able to obtain at least an endometrial biopsy. 2. Patient is DNR, and her brother's wish is primarily for comfort.  If cancer is suspected, tissue biopsy would confirm and allow for appropriate guidance for treatment possibilities and/or palliative care. 3.  Admit patient for OBS and plan for OR after scheduled cases later this morning.  Hold warfarin admin.     Thank you for the opportunity to be involved with this patient's care.   ----- Ranae Plumber, MD Attending Obstetrician and Gynecologist [facility]

## 2015-01-03 NOTE — Anesthesia Preprocedure Evaluation (Addendum)
Anesthesia Evaluation  Patient identified by MRN, date of birth, ID band Patient awake    Reviewed: Allergy & Precautions, NPO status , Patient's Chart, lab work & pertinent test results  Airway Mallampati: III  TM Distance: <3 FB Neck ROM: Limited  Mouth opening: Limited Mouth Opening  Dental no notable dental hx.    Pulmonary former smoker,  HX of allergic rhinitis   Pulmonary exam normal       Cardiovascular + CAD and + Past MI Normal cardiovascular exam    Neuro/Psych Anxiety Depression Hx of MS and functional quad  Neuromuscular disease    GI/Hepatic Neg liver ROS,   Endo/Other  negative endocrine ROS  Renal/GU negative Renal ROS     Musculoskeletal MS   Abdominal Normal abdominal exam  (+)   Peds  Hematology negative hematology ROS (+)   Anesthesia Other Findings   Reproductive/Obstetrics                          Anesthesia Physical Anesthesia Plan  ASA: III and emergent  Anesthesia Plan: General   Post-op Pain Management:    Induction: Intravenous  Airway Management Planned: Nasal Cannula  Additional Equipment:   Intra-op Plan:   Post-operative Plan:   Informed Consent: I have reviewed the patients History and Physical, chart, labs and discussed the procedure including the risks, benefits and alternatives for the proposed anesthesia with the patient or authorized representative who has indicated his/her understanding and acceptance.   Dental advisory given  Plan Discussed with: CRNA and Surgeon  Anesthesia Plan Comments: (Will do light TIVA.  Discussed the case with the patient's brother.  He understands the risks and agrees to the procedure)       Anesthesia Quick Evaluation

## 2015-01-03 NOTE — ED Notes (Signed)
Sacral wound measured 2.2x1.4cm and 1.6cm deep.  Wound dressed by this RN and April, RN.

## 2015-01-03 NOTE — Anesthesia Procedure Notes (Signed)
Procedure Name: MAC Performed by: Laurali Goddard Oxygen Delivery Method: Circle system utilized

## 2015-01-03 NOTE — OR Nursing (Signed)
Patient in pacu co of being cold warm blankets applied

## 2015-01-03 NOTE — Transfer of Care (Signed)
Immediate Anesthesia Transfer of Care Note  Patient: Brittany Madden  Procedure(s) Performed: Procedure(s): EXAM UNDER ANESTHESIA, repair of vaginal laceration  (Right)  Patient Location: PACU  Anesthesia Type:MAC  Level of Consciousness: awake and responds to stimulation  Airway & Oxygen Therapy: Patient Spontanous Breathing  Post-op Assessment: Report given to RN  Post vital signs: stable  Last Vitals:  Filed Vitals:   01/03/15 1355  BP: 144/98  Pulse: 88  Temp: 36.4 C  Resp: 20    Complications: No apparent anesthesia complications

## 2015-01-03 NOTE — Consult Note (Addendum)
[facility] [place] [facility]   PATIENT NAME: Brittany Madden    MR#:  [id number]  DATE OF BIRTH:  1960/08/01  DATE OF ADMISSION:  01/02/2015  PRIMARY CARE PHYSICIAN: Manus Rudd LEE, DO   REQUESTING/REFERRING PHYSICIAN: Dr. Elesa Massed  CHIEF COMPLAINT:   Chief Complaint  Patient presents with  . GI Bleeding    HISTORY OF PRESENT ILLNESS:  Brittany Madden  is a 54 y.o. female who presents with vaginal bleeding. She was admitted to the gynecology service and was found to have a vaginal laceration which required repair. 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 She also has a history of PE/DVT in 2012 for which she is on chronic anticoagulation with Coumadin. Her INR is currently at goal at a level of 2.6, but given the extensive difficulty in controlling her vaginal bleeding, and especially after operative repair our service was consulted for assistance in managing INR reversal.  PAST MEDICAL HISTORY:   Past Medical History  Diagnosis Date  . MS (multiple sclerosis)   . Depression   . Functional quadriplegia   . Neurogenic bladder   . PE (pulmonary embolism) 2012  . DVT (deep venous thrombosis) 2012    PAST SURGICAL HISTOIRY:   Past Surgical History  Procedure Laterality Date  . Tonsillectomy    . Laparoscopic tubal ligation      SOCIAL HISTORY:   History  Substance Use Topics  . Smoking status: Unknown If Ever Smoked  . Smokeless tobacco: Not on file     Comment:     . Alcohol Use: No    FAMILY HISTORY:  History reviewed. No pertinent family history.  DRUG ALLERGIES:   Allergies  Allergen Reactions  . Asa [Aspirin] Other (See Comments)    Reaction: unknown  . Keflex [Cephalexin] Other (See Comments)    Reaction: unknown   . Nsaids Other (See Comments)    Reaction: unknown   . Other Other (See Comments)    SEAFOOD  . Salicylates Other (See Comments)    Reaction: unknown    REVIEW OF  SYSTEMS:  Review of Systems  Constitutional: Negative for fever, chills, weight loss and malaise/fatigue.  HENT: Negative for ear pain, hearing loss and tinnitus.   Eyes: Negative for blurred vision, double vision, pain and redness.  Respiratory: Negative for cough, hemoptysis and shortness of breath.   Cardiovascular: Negative for chest pain, palpitations, orthopnea and leg swelling.  Gastrointestinal: Negative for nausea, vomiting, abdominal pain, diarrhea and constipation.  Genitourinary: Negative for dysuria, frequency and hematuria.       Vaginal bleeding  Musculoskeletal: Negative for back pain, joint pain and neck pain.  Skin:       No acne, rash, or lesions  Neurological: Negative for dizziness, tremors, focal weakness and weakness.  Endo/Heme/Allergies: Negative for polydipsia. Does not bruise/bleed easily.  Psychiatric/Behavioral: Negative for depression. The patient is not nervous/anxious and does not have insomnia.     MEDICATIONS AT HOME:   Prior to Admission medications   Medication Sig Start Date End Date Taking? Authorizing Provider  baclofen (LIORESAL) 20 MG tablet 20 mg by Feeding Tube route every 6 (six) hours.   Yes Historical Provider, MD  carboxymethylcellulose (REFRESH TEARS) 0.5 % SOLN Place 1 drop into both eyes 2 (two) times daily.   Yes Historical Provider, MD  escitalopram (LEXAPRO) 5 MG/5ML solution 10 mg by Feeding Tube route daily.   Yes Historical Provider, MD  gabapentin (NEURONTIN) 250  MG/5ML solution Place 600 mg into feeding tube 3 (three) times daily.   Yes Historical Provider, MD  lactulose (CHRONULAC) 10 GM/15ML solution Take 20 g by mouth at bedtime.   Yes Historical Provider, MD  mineral oil-hydrophilic petrolatum (AQUAPHOR) ointment Apply 1 application topically daily. To feet and legs.   Yes Historical Provider, MD  omeprazole (PRILOSEC) 20 MG capsule 20 mg by Feeding Tube route every morning.   Yes Historical Provider, MD  potassium chloride SA  (K-DUR,KLOR-CON) 20 MEQ tablet 20 mEq by Feeding Tube route daily. Dissolve in water then give via g-tube.   Yes Historical Provider, MD  warfarin (COUMADIN) 2.5 MG tablet 2.5 mg by Feeding Tube route daily. Take along with 4mg  tab every Sunday, Tuesday, Thursday and Saturday morning to equal 6.5mg  dose.   Yes Historical Provider, MD  warfarin (COUMADIN) 4 MG tablet 4 mg by Feeding Tube route daily. Give along with 2.5mg  tab every Sunday, Tuesday, Thursday and Saturday mornings, equaling 6.5mg    Yes Historical Provider, MD  warfarin (COUMADIN) 6 MG tablet 6 mg by Feeding Tube route daily. Every Monday, Wednesday and Friday.   Yes Historical Provider, MD      VITAL SIGNS:   Filed Vitals:   01/03/15 1414 01/03/15 1544 01/03/15 1632 01/03/15 1645  BP: 106/71 90/64 104/72   Pulse: 86 90 98   Temp:  98.3 F (36.8 C) 99.6 F (37.6 C) 98.5 F (36.9 C)  TempSrc:  Axillary Axillary Axillary  Resp: 17 17 17    Height:      Weight:      SpO2: 100% 100% 99%    Wt Readings from Last 3 Encounters:  01/03/15 47.174 kg (104 lb)    PHYSICAL EXAMINATION:  Physical Exam  Vitals reviewed. Constitutional: She appears well-developed and well-nourished. No distress.  HENT:  Head: Normocephalic and atraumatic.  Mouth/Throat: Oropharynx is clear and moist.  Eyes: Conjunctivae and EOM are normal. Pupils are equal, round, and reactive to light. No scleral icterus.  Neck: Normal range of motion. Neck supple. No JVD present. No thyromegaly present.  Cardiovascular: Normal rate, regular rhythm and intact distal pulses.  Exam reveals no gallop and no friction rub.   No murmur heard. Respiratory: Effort normal and breath sounds normal. No respiratory distress. She has no wheezes. She has no rales.  GI: Soft. Bowel sounds are normal. She exhibits no distension. There is no tenderness.  Genitourinary:  Vaginal bleeding  Musculoskeletal: She exhibits no edema.  Significant bilateral upper extremity  contractures with some lower extremity right greater than left contractures well  Lymphadenopathy:    She has no cervical adenopathy.  Neurological: She is alert.  Unable to fully assess due to patient baseline condition  Skin: Skin is warm and dry. No rash noted. No erythema.  Psychiatric:  Unable to assess due to patient baseline condition     LABORATORY PANEL:   CBC  Recent Labs Lab 01/02/15 2310  WBC 9.2  HGB 12.6  HCT 38.3  PLT 250   ------------------------------------------------------------------------------------------------------------------  Chemistries   Recent Labs Lab 01/02/15 2310 01/02/15 2322  NA 135  --   K 4.4  --   CL 106  --   CO2 22  --   GLUCOSE 100*  --   BUN 13  --   CREATININE <0.30*  --   CALCIUM 9.0  --   AST  --  32  ALT  --  29  ALKPHOS  --  78  BILITOT  --  0.2*   ------------------------------------------------------------------------------------------------------------------  Cardiac Enzymes No results for input(s): TROPONINI in the last 168 hours. ------------------------------------------------------------------------------------------------------------------  RADIOLOGY:  US Transvaginal Non-ob  01/03/2015   CLINICAL DATA:  Vaginal bleeding  EXAM: TRANSABDOMINAL AND TRANSVAGINAL ULTRASOUND OF PELVIS  TECHNIQUE: Both transabdominal and transvaginal ultrasound examinations of the pelvis were performed. Transabdominal technique was performed for global imaging of the pelvis including uterus, ovaries, adnexal regions, and pelvic cul-de-sac. It was necessary to proceed with endovaginal exam following the transabdominal exam to visualize the ovaries.  COMPARISON:  None  FINDINGS: Uterus  Measurements: 6.9 x 3.5 x 4.0 cm. No fibroids or other mass visualized.  Endometrium  Thickness: 4.8 mm.  No focal abnormality visualized.  Right ovary  Measurements: 2.7 x 1.6 x 1.4 cm. Normal appearance/no adnexal mass.  Left ovary  Measurements: 3.8 x  3.1 x 3.2 cm. Normal appearance/no adnexal mass.  Other findings  No free fluid.  IMPRESSION: No significant abnormality   Electronically Signed   By: Ellery Plunk M.D.   On: 01/03/2015 02:57   US Pelvis Complete  01/03/2015   CLINICAL DATA:  Vaginal bleeding  EXAM: TRANSABDOMINAL AND TRANSVAGINAL ULTRASOUND OF PELVIS  TECHNIQUE: Both transabdominal and transvaginal ultrasound examinations of the pelvis were performed. Transabdominal technique was performed for global imaging of the pelvis including uterus, ovaries, adnexal regions, and pelvic cul-de-sac. It was necessary to proceed with endovaginal exam following the transabdominal exam to visualize the ovaries.  COMPARISON:  None  FINDINGS: Uterus  Measurements: 6.9 x 3.5 x 4.0 cm. No fibroids or other mass visualized.  Endometrium  Thickness: 4.8 mm.  No focal abnormality visualized.  Right ovary  Measurements: 2.7 x 1.6 x 1.4 cm. Normal appearance/no adnexal mass.  Left ovary  Measurements: 3.8 x 3.1 x 3.2 cm. Normal appearance/no adnexal mass.  Other findings  No free fluid.  IMPRESSION: No significant abnormality   Electronically Signed   By: Ellery Plunk M.D.   On: 01/03/2015 02:57    EKG:   Orders placed or performed in visit on 09/28/12  . EKG 12-Lead    IMPRESSION AND PLAN:  Active Problems:   Vaginal bleeding - status post repair by primary service, gynecology. Hemoglobin was 12 on admission yesterday, recommend rechecking this level now. Defer primary treatment of this problem to them.   Chronic anticoagulation - patient is on chronic anticoagulation with Coumadin for prior history of DVT and PE, her current INR was at goal, but is likely contributing to her excessive bleeding. We recommend holding Coumadin at this time.  If bleeding is unable to be controlled with packing alone, we recommend administering reversal agent with vitamin K conservatively with 2.5 mg orally to start and recheck INR later tonight, or in the morning  depending on when the vitamin K was given. If bleeding persists after this and INR has not fallen to less than 1.5, another 2.5 mg can be given.   History of DVT (deep vein thrombosis) - per chart review after phone call with her care facility this occurred in 2012 or before.   History of pulmonary embolus (PE) - per chart review after phone call with her care facility this occurred in 2012 or before.  All the records are reviewed and case discussed with ED provider. Management plans discussed with the patient and/or family.  CODE STATUS:     Code Status Orders        Start     Ordered   01/03/15 0546  Do not attempt  resuscitation (DNR)   Continuous    Question Answer Comment  In the event of cardiac or respiratory ARREST Do not call a "code blue"   In the event of cardiac or respiratory ARREST Do not perform Intubation, CPR, defibrillation or ACLS   In the event of cardiac or respiratory ARREST Use medication by any route, position, wound care, and other measures to relive pain and suffering. May use oxygen, suction and manual treatment of airway obstruction as needed for comfort.      01/03/15 0549    Advance Directive Documentation        Most Recent Value   Type of Advance Directive  -- [DNR]   Pre-existing out of facility DNR order (yellow form or pink MOST form)     "MOST" Form in Place?      DNR  TOTAL TIME TAKING CARE OF THIS PATIENT: 40 minutes.    Dianelly Ferran FIELDING 01/03/2015, 5:21 PM  [employer] [facility]  Office  [phone]  CC: Primary care Physician: Manus Rudd LEE, DO

## 2015-01-04 ENCOUNTER — Encounter: Payer: Self-pay | Admitting: Obstetrics & Gynecology

## 2015-01-04 DIAGNOSIS — L89153 Pressure ulcer of sacral region, stage 3: Secondary | ICD-10-CM | POA: Diagnosis present

## 2015-01-04 DIAGNOSIS — E876 Hypokalemia: Secondary | ICD-10-CM | POA: Diagnosis present

## 2015-01-04 DIAGNOSIS — X58XXXA Exposure to other specified factors, initial encounter: Secondary | ICD-10-CM | POA: Diagnosis present

## 2015-01-04 DIAGNOSIS — D649 Anemia, unspecified: Secondary | ICD-10-CM | POA: Diagnosis present

## 2015-01-04 DIAGNOSIS — R000 Tachycardia, unspecified: Secondary | ICD-10-CM | POA: Diagnosis present

## 2015-01-04 DIAGNOSIS — R64 Cachexia: Secondary | ICD-10-CM | POA: Diagnosis present

## 2015-01-04 DIAGNOSIS — G35 Multiple sclerosis: Secondary | ICD-10-CM | POA: Diagnosis present

## 2015-01-04 DIAGNOSIS — Z7901 Long term (current) use of anticoagulants: Secondary | ICD-10-CM | POA: Diagnosis not present

## 2015-01-04 DIAGNOSIS — R571 Hypovolemic shock: Secondary | ICD-10-CM | POA: Diagnosis present

## 2015-01-04 DIAGNOSIS — G8250 Quadriplegia, unspecified: Secondary | ICD-10-CM | POA: Diagnosis present

## 2015-01-04 DIAGNOSIS — Z86718 Personal history of other venous thrombosis and embolism: Secondary | ICD-10-CM | POA: Diagnosis not present

## 2015-01-04 DIAGNOSIS — E871 Hypo-osmolality and hyponatremia: Secondary | ICD-10-CM | POA: Diagnosis present

## 2015-01-04 DIAGNOSIS — M810 Age-related osteoporosis without current pathological fracture: Secondary | ICD-10-CM | POA: Diagnosis present

## 2015-01-04 DIAGNOSIS — S3141XA Laceration without foreign body of vagina and vulva, initial encounter: Secondary | ICD-10-CM | POA: Diagnosis present

## 2015-01-04 DIAGNOSIS — L8990 Pressure ulcer of unspecified site, unspecified stage: Secondary | ICD-10-CM | POA: Insufficient documentation

## 2015-01-04 DIAGNOSIS — D689 Coagulation defect, unspecified: Secondary | ICD-10-CM | POA: Diagnosis present

## 2015-01-04 DIAGNOSIS — N939 Abnormal uterine and vaginal bleeding, unspecified: Secondary | ICD-10-CM | POA: Diagnosis present

## 2015-01-04 DIAGNOSIS — K922 Gastrointestinal hemorrhage, unspecified: Secondary | ICD-10-CM | POA: Diagnosis present

## 2015-01-04 DIAGNOSIS — Z66 Do not resuscitate: Secondary | ICD-10-CM | POA: Diagnosis present

## 2015-01-04 DIAGNOSIS — Z86711 Personal history of pulmonary embolism: Secondary | ICD-10-CM | POA: Diagnosis not present

## 2015-01-04 LAB — APTT
aPTT: 53 seconds — ABNORMAL HIGH (ref 24–36)
aPTT: 43 seconds — ABNORMAL HIGH (ref 24–36)
aPTT: 36 seconds (ref 24–36)

## 2015-01-04 LAB — CBC
HCT: 28.3 % — ABNORMAL LOW (ref 35.0–47.0)
HEMATOCRIT: 19.2 % — AB (ref 35.0–47.0)
HEMOGLOBIN: 6.3 g/dL — AB (ref 12.0–16.0)
Hemoglobin: 9.5 g/dL — ABNORMAL LOW (ref 12.0–16.0)
MCH: 27.2 pg (ref 26.0–34.0)
MCH: 28.6 pg (ref 26.0–34.0)
MCHC: 32.9 g/dL (ref 32.0–36.0)
MCHC: 33.7 g/dL (ref 32.0–36.0)
MCV: 82.7 fL (ref 80.0–100.0)
MCV: 85 fL (ref 80.0–100.0)
PLATELETS: 140 10*3/uL — AB (ref 150–440)
Platelets: 154 10*3/uL (ref 150–440)
RBC: 2.32 MIL/uL — ABNORMAL LOW (ref 3.80–5.20)
RBC: 3.33 MIL/uL — AB (ref 3.80–5.20)
RDW: 14.4 % (ref 11.5–14.5)
RDW: 14.6 % — ABNORMAL HIGH (ref 11.5–14.5)
WBC: 8.8 10*3/uL (ref 3.6–11.0)
WBC: 10.7 10*3/uL (ref 3.6–11.0)

## 2015-01-04 LAB — GLUCOSE, CAPILLARY
Glucose-Capillary: 118 mg/dL — ABNORMAL HIGH (ref 65–99)
Glucose-Capillary: 77 mg/dL (ref 65–99)

## 2015-01-04 LAB — PROTIME-INR
INR: 2.88
INR: 2.23
INR: 1.48
PROTHROMBIN TIME: 18.1 s — AB (ref 11.4–15.0)
Prothrombin Time: 30.2 seconds — ABNORMAL HIGH (ref 11.4–15.0)
Prothrombin Time: 24.8 seconds — ABNORMAL HIGH (ref 11.4–15.0)

## 2015-01-04 LAB — FIBRINOGEN
FIBRINOGEN: 373 mg/dL (ref 210–470)
FIBRINOGEN: 397 mg/dL (ref 210–470)

## 2015-01-04 LAB — PREPARE RBC (CROSSMATCH)

## 2015-01-04 LAB — MRSA PCR SCREENING: MRSA by PCR: NEGATIVE

## 2015-01-04 MED ORDER — SODIUM CHLORIDE 0.9 % IV SOLN
Freq: Once | INTRAVENOUS | Status: AC
  Administered 2015-01-04: 05:00:00 via INTRAVENOUS

## 2015-01-04 MED ORDER — DEXTROSE 5 % IV SOLN
5.0000 mg | Freq: Once | INTRAVENOUS | Status: AC
  Administered 2015-01-04: 5 mg via INTRAVENOUS
  Filled 2015-01-04: qty 0.5

## 2015-01-04 MED ORDER — PRO-STAT SUGAR FREE PO LIQD
30.0000 mL | Freq: Every day | ORAL | Status: DC
  Administered 2015-01-04 – 2015-01-09 (×5): 30 mL

## 2015-01-04 MED ORDER — JEVITY 1.2 CAL PO LIQD: 1000.0000 mL | ORAL | Status: DC

## 2015-01-04 MED ORDER — SODIUM CHLORIDE 0.9 % IV SOLN
Freq: Once | INTRAVENOUS | Status: AC
  Administered 2015-01-04: 09:00:00 via INTRAVENOUS

## 2015-01-04 MED ORDER — GABAPENTIN 300 MG PO CAPS
600.0000 mg | ORAL_CAPSULE | Freq: Three times a day (TID) | ORAL | Status: DC
  Administered 2015-01-04 – 2015-01-09 (×15): 600 mg
  Filled 2015-01-04 (×15): qty 2

## 2015-01-04 MED ORDER — BACLOFEN 10 MG PO TABS
20.0000 mg | ORAL_TABLET | Freq: Four times a day (QID) | ORAL | Status: DC
  Administered 2015-01-04 – 2015-01-09 (×20): 20 mg via ORAL
  Filled 2015-01-04 (×20): qty 2

## 2015-01-04 MED ORDER — FENTANYL CITRATE (PF) 100 MCG/2ML IJ SOLN
25.0000 ug | INTRAMUSCULAR | Status: DC | PRN
  Administered 2015-01-04: 25 ug via INTRAVENOUS
  Filled 2015-01-04: qty 2

## 2015-01-04 MED ORDER — PHYTONADIONE 5 MG PO TABS
5.0000 mg | ORAL_TABLET | Freq: Once | ORAL | Status: AC
  Administered 2015-01-04: 5 mg via ORAL
  Filled 2015-01-04: qty 1

## 2015-01-04 MED ORDER — PHYTONADIONE 1 MG/0.5 ML ORAL SOLUTION: 5.0000 mg | Freq: Once | ORAL | Status: DC

## 2015-01-04 MED ORDER — ESCITALOPRAM OXALATE 10 MG PO TABS
5.0000 mg | ORAL_TABLET | Freq: Every day | ORAL | Status: DC
  Administered 2015-01-04 – 2015-01-07 (×4): 5 mg
  Filled 2015-01-04 (×4): qty 1

## 2015-01-04 MED ORDER — JEVITY 1.5 CAL/FIBER PO LIQD
237.0000 mL | Freq: Four times a day (QID) | ORAL | Status: DC
  Administered 2015-01-04 – 2015-01-09 (×19): 237 mL

## 2015-01-04 MED ORDER — LACTATED RINGERS IV BOLUS (SEPSIS)
500.0000 mL | Freq: Once | INTRAVENOUS | Status: AC
  Administered 2015-01-04: 500 mL via INTRAVENOUS

## 2015-01-04 NOTE — H&P (Signed)
Obstetric and Gynecology  Subjective  Doing well, denies pain.  Sister in law at bedside  Objective   Filed Vitals:   01/04/15 1130 01/04/15 1158 01/04/15 1200 01/04/15 1417  BP: 102/78 99/70 104/76   Pulse: 112 111 109   Temp:  99 F (37.2 C) 99 F (37.2 C) 98.6 F (37 C)  TempSrc:  Oral Oral Oral  Resp: Height:      Weight:      SpO2: 96% 99% 99%      Intake/Output Summary (Last 24 hours) at 01/04/15 1742 Last data filed at 01/04/15 1532  Gross per 24 hour  Intake 2733.58 ml  Output    150 ml  Net 2583.58 ml    General:NAD Abdomen: soft, non-tender Extremities: no edema  Labs: Results for orders placed or performed during the hospital encounter of 01/02/15 (from the past 24 hour(s))  Protime-INR     Status: Abnormal   Collection Time: 01/03/15 10:43 PM  Result Value Ref Range   Prothrombin Time 31.0 (H) 11.4 - 15.0 seconds   INR 2.98   CBC     Status: Abnormal   Collection Time: 01/03/15 10:43 PM  Result Value Ref Range   WBC 10.5 3.6 - 11.0 K/uL   RBC 2.60 (L) 3.80 - 5.20 MIL/uL   Hemoglobin 7.0 (L) 12.0 - 16.0 g/dL   HCT 29.5 (L) 62.1 - 30.8 %   MCV 82.1 80.0 - 100.0 fL   MCH 27.1 26.0 - 34.0 pg   MCHC 33.0 32.0 - 36.0 g/dL   RDW 65.7 84.6 - 96.2 %   Platelets 166 150 - 440 K/uL  Protime-INR     Status: Abnormal   Collection Time: 01/04/15  4:53 AM  Result Value Ref Range   Prothrombin Time 30.2 (H) 11.4 - 15.0 seconds   INR 2.88   CBC     Status: Abnormal   Collection Time: 01/04/15  4:53 AM  Result Value Ref Range   WBC 8.8 3.6 - 11.0 K/uL   RBC 2.32 (L) 3.80 - 5.20 MIL/uL   Hemoglobin 6.3 (L) 12.0 - 16.0 g/dL   HCT 95.2 (L) 84.1 - 32.4 %   MCV 82.7 80.0 - 100.0 fL   MCH 27.2 26.0 - 34.0 pg   MCHC 32.9 32.0 - 36.0 g/dL   RDW 40.1 02.7 - 25.3 %   Platelets 154 150 - 440 K/uL  Fibrinogen     Status: None   Collection Time: 01/04/15  4:53 AM  Result Value Ref Range   Fibrinogen 373 210 - 470 mg/dL  APTT     Status: Abnormal   Collection Time: 01/04/15  4:53 AM  Result Value Ref Range   aPTT 53 (H) 24 - 36 seconds  Prepare RBC     Status: None   Collection Time: 01/04/15  5:30 AM  Result Value Ref Range   Order Confirmation ORDER PROCESSED BY BLOOD BANK   Prepare RBC     Status: None   Collection Time: 01/04/15  6:29 AM  Result Value Ref Range   Order Confirmation DUPLICATE ORDER. 2 RBCS READY FOR TRANSFUSION   MRSA PCR Screening     Status: None   Collection Time: 01/04/15  8:15 AM  Result Value Ref Range   MRSA by PCR NEGATIVE NEGATIVE  Fibrinogen     Status: None   Collection Time: 01/04/15  8:29 AM  Result Value Ref Range   Fibrinogen 397 210 -  470 mg/dL  Protime-INR     Status: Abnormal   Collection Time: 01/04/15  8:29 AM  Result Value Ref Range   Prothrombin Time 24.8 (H) 11.4 - 15.0 seconds   INR 2.23   APTT     Status: Abnormal   Collection Time: 01/04/15  8:29 AM  Result Value Ref Range   aPTT 43 (H) 24 - 36 seconds  Prepare RBC     Status: None   Collection Time: 01/04/15  8:54 AM  Result Value Ref Range   Order Confirmation      ORDER PROCESSED BY BLOOD BANK ORDER IS NOT EMERGENT BUT IS STAT. AUTOLOGOUS/ DIRECTED NOT INDICATED. ERROR IN ORDERING. C/BRITTANY KILLINGSWORTH 01/04/15 0900 [facility]  Prepare fresh frozen plasma     Status: None (Preliminary result)   Collection Time: 01/04/15  9:02 AM  Result Value Ref Range   Unit Number [id number]    Blood Component Type THAWED PLASMA    Unit division 00    Status of Unit ISSUED    Transfusion Status OK TO TRANSFUSE   CBC     Status: Abnormal   Collection Time: 01/04/15  3:32 PM  Result Value Ref Range   WBC 10.7 3.6 - 11.0 K/uL   RBC 3.33 (L) 3.80 - 5.20 MIL/uL   Hemoglobin 9.5 (L) 12.0 - 16.0 g/dL   HCT 52.0 (L) 80.2 - 23.3 %   MCV 85.0 80.0 - 100.0 fL   MCH 28.6 26.0 - 34.0 pg   MCHC 33.7 32.0 - 36.0 g/dL   RDW 61.2 (H) 24.4 - 97.5 %   Platelets 140 (L) 150 - 440 K/uL  Protime-INR     Status: Abnormal   Collection Time:  01/04/15  3:32 PM  Result Value Ref Range   Prothrombin Time 18.1 (H) 11.4 - 15.0 seconds   INR 1.48   APTT     Status: None   Collection Time: 01/04/15  3:32 PM  Result Value Ref Range   aPTT 36 24 - 36 seconds    Cultures: Results for orders placed or performed during the hospital encounter of 01/02/15  MRSA PCR Screening     Status: None   Collection Time: 01/04/15  8:15 AM  Result Value Ref Range Status   MRSA by PCR NEGATIVE NEGATIVE Final    Comment:        The GeneXpert MRSA Assay (FDA approved for NASAL specimens only), is one component of a comprehensive MRSA colonization surveillance program. It is not intended to diagnose MRSA infection nor to guide or monitor treatment for MRSA infections.     Imaging:  Assessment   54 y.o. with vaginal bleeding secondary to vaginal laceration, supertheraputic INR  Plan   1) Vaginal bleeding - 4 pad changes today.  Serosanguinous.  Bakri ballon taken down to 70mL from . - 1 additional unit of pRBC given tachycardia - If bleeding and CBC stable may continue to take down bakri overnight  2) Tachycardia - sinus tach on monitor.  Will also have patient take a dose of fentanyl to verify not pain related although patient denies pain

## 2015-01-04 NOTE — Progress Notes (Addendum)
   01/04/15 1100  Clinical Encounter Type  Visited With Patient;Health care provider  Visit Type Initial  Referral From Family  Consult/Referral To Chaplain  Spiritual Encounters  Spiritual Needs Emotional  Stress Factors  Patient Stress Factors None identified  Chaplain rounded in the unit and met with the patient's family to provide support and a compassioante presence. Chaplain Sou Nohr A. [facility] [place] Ext. [phone]

## 2015-01-04 NOTE — Progress Notes (Signed)
Summary of recent events:  The 2200 labs showed a Hb of 7.0 and INR of 2.99.   vit K was ordered and given via G tube.  Repeat labs at 0500 showed continued fall in Hb of 6.3 and INR of 2.88. Ordered 2 units of PRBCs to be transfused. Notified at 06:30 that patient had become acutely hypotensive at 80/45 with pulse consisently in the 120s.  Consulted pharmacy and ordered  IV Vit K and requested 1 unit FFP.  Requested transfer to ICU for higher acuity monitoring and medical management.    Spoke to Dr. Sheryle Hail for transfer information.  Patient moved immediately. Removed vaginal packing and placed a Bakri balloon intravaginally, with 120cc of NS in balloon.  Foley catheter remains in place and draining blood tinged fluid.    Fibrinogen ordered from 0500 labs and returned 373, and re-drew coag panel at 0830 to assess for DIC.    After 1st unit PRBCs transfused, BP improved to 101/73 and pulse 110.  Patient describes some pain; Fentanyl IV ordered q1hr PRN.  Dr. Bonney Aid spoke with IR (Dr. Wyn Quaker) regarding possibilities of vascular occlusion, and given her INR and the vascular pedicle from which she is bleeding, the likelyhood of success at this point would be minimal.  Once she is reversed, the intervention may be considered.    Patient's brother/POA, Mr. Kathrynn Running, has been informed and kept abreast of her clinical progress/timeline throughout her stay, and was called by me this morning.  The patient's sister in law, Francee Piccolo, is now at her bedside.   I spoke with Daytime Hospitalist regarding restarting patient's tube feeds, as they were held yesterday.  The type and composition of what she receives daily at [facility] are available through her transfer records.  Dietary will be consulted for this order and administration.   Dr. Bonney Aid will be the GYN to contact for care coordination and questions for the next 24 hours, however I will be happy to remain involved if needed, and can be  paged at any time.    ----- Ranae Plumber, MD Attending Obstetrician and Gynecologist [facility]

## 2015-01-04 NOTE — Progress Notes (Signed)
Dr Elesa Massed notified of decreasing blood pressure, increasing bleeding, orders given

## 2015-01-04 NOTE — Care Management (Signed)
Contact Jorja Loa (brother) [phone]. Patient if from [facility]. CSW updated.

## 2015-01-04 NOTE — Progress Notes (Addendum)
Initial Nutrition Assessment    INTERVENTION:  EN: pt receives TwoCal HN via PEG as outpatient; do not have this formula on hospital formulary; while in hospital, recommend changing to Jevity 1.5 bolus feedings of 1 can 4 times daily providing 1420 kcals, 60 g of protein, 720 mL of free water. Recommend addition of Prostat daily to better meet nutritional needs acutely (additional 15 g of protein, 100 kcals). Recommend free water flush of 50 mL before and after each feeding; with no IVF, recommend additional 200 mL free water flush BID. Continue to assess   NUTRITION DIAGNOSIS:   Inadequate oral intake related to inability to eat, acute illness as evidenced by NPO status.   GOAL:   Patient will meet greater than or equal to 90% of their needs   MONITOR:    (EN, Digestive System, Electrolyte/Renal Profile, Glucose Profile, Anthropometrics)  REASON FOR ASSESSMENT:   Consult Enteral/tube feeding initiation and management  ASSESSMENT:    Pt admitted with hemorrhagic shock due to bleeding from vaginal tear; pt with chronic PEG tube, hx of MS, bed bound, contracted, quadraplegia  Past Medical History  Diagnosis Date  . MS (multiple sclerosis)   . Depression   . Functional quadriplegia   . Neurogenic bladder   . PE (pulmonary embolism) 2012  . DVT (deep venous thrombosis) 2012   Diet Order: NPO  Food and Nutrition Related History: Spoke with Dorrene German (RD at [facility]); RD reports pt has been taking TwoCal HN bolud feedings, 1 can 3 times daily as outpatient (1425 kcals, 60 g of protein). Per RD, pt also on Dysphagia I, Nectar Thick diet but takes very minimal po. Primary source of nutrition is via tube.   Skin:   (stage II pressure ulcer); pressure ulcer improved per Maralyn Sago RD  Last BM:  7/31   Nutrition Focused Physical Exam:  Unable to complete Nutrition-Focused physical exam at this time.   Electrolyte and Renal Profile:  Recent Labs Lab 01/02/15 2310  BUN  13  CREATININE <0.30*  NA 135  K 4.4   Glucose Profile: No results for input(s): GLUCAP in the last 72 hours. Protein Profile:   Recent Labs Lab 01/02/15 2322  ALBUMIN 3.3*    Meds: NS with KCl at 50 ml/hr  Height:   Ht Readings from Last 1 Encounters:  01/02/15 5\' 5"  (1.651 m)    Weight: per Maralyn Sago RD, pt has actually being gaining weight with TF at [facility] Readings from Last 1 Encounters:  01/03/15 104 lb (47.174 kg)     BMI:  Body mass index is 17.31 kg/(m^2).  Estimated Nutritional Needs:   Kcal:  1435-1694 kcals (BEE 1086, 1.2 AF, 1.1-1.3 IF)  Protein:  61-75 g (1.3-1.6 g/kg)   Fluid:  1410-1645 mL (30-35 ml/kg)   MODERATE Care Level  Romelle Starcher MS, RD, LDN [phone] Pager

## 2015-01-04 NOTE — Progress Notes (Signed)
Obstetric and Gynecology  Subjective  Family at bedsides (sister in law) updated  Objective   Filed Vitals:   01/04/15 1200  BP: 104/76  Pulse: 109  Temp: 99 F (37.2 C)  Resp: 27     Intake/Output Summary (Last 24 hours) at 01/04/15 1330 Last data filed at 01/04/15 1225  Gross per 24 hour  Intake 1672.25 ml  Output    700 ml  Net 972.25 ml    General: NAD Foley: now draining clear as opposed to blood tinged urine  Labs: Results for orders placed or performed during the hospital encounter of 01/02/15 (from the past 24 hour(s))  Protime-INR     Status: Abnormal   Collection Time: 01/03/15 10:43 PM  Result Value Ref Range   Prothrombin Time 31.0 (H) 11.4 - 15.0 seconds   INR 2.98   CBC     Status: Abnormal   Collection Time: 01/03/15 10:43 PM  Result Value Ref Range   WBC 10.5 3.6 - 11.0 K/uL   RBC 2.60 (L) 3.80 - 5.20 MIL/uL   Hemoglobin 7.0 (L) 12.0 - 16.0 g/dL   HCT 16.1 (L) 09.6 - 04.5 %   MCV 82.1 80.0 - 100.0 fL   MCH 27.1 26.0 - 34.0 pg   MCHC 33.0 32.0 - 36.0 g/dL   RDW 40.9 81.1 - 91.4 %   Platelets 166 150 - 440 K/uL  Protime-INR     Status: Abnormal   Collection Time: 01/04/15  4:53 AM  Result Value Ref Range   Prothrombin Time 30.2 (H) 11.4 - 15.0 seconds   INR 2.88   CBC     Status: Abnormal   Collection Time: 01/04/15  4:53 AM  Result Value Ref Range   WBC 8.8 3.6 - 11.0 K/uL   RBC 2.32 (L) 3.80 - 5.20 MIL/uL   Hemoglobin 6.3 (L) 12.0 - 16.0 g/dL   HCT 78.2 (L) 95.6 - 21.3 %   MCV 82.7 80.0 - 100.0 fL   MCH 27.2 26.0 - 34.0 pg   MCHC 32.9 32.0 - 36.0 g/dL   RDW 08.6 57.8 - 46.9 %   Platelets 154 150 - 440 K/uL  Fibrinogen     Status: None   Collection Time: 01/04/15  4:53 AM  Result Value Ref Range   Fibrinogen 373 210 - 470 mg/dL  APTT     Status: Abnormal   Collection Time: 01/04/15  4:53 AM  Result Value Ref Range   aPTT 53 (H) 24 - 36 seconds  Prepare RBC     Status: None   Collection Time: 01/04/15  5:30 AM  Result Value Ref  Range   Order Confirmation ORDER PROCESSED BY BLOOD BANK   Prepare RBC     Status: None   Collection Time: 01/04/15  6:29 AM  Result Value Ref Range   Order Confirmation DUPLICATE ORDER. 2 RBCS READY FOR TRANSFUSION   MRSA PCR Screening     Status: None   Collection Time: 01/04/15  8:15 AM  Result Value Ref Range   MRSA by PCR NEGATIVE NEGATIVE  Fibrinogen     Status: None   Collection Time: 01/04/15  8:29 AM  Result Value Ref Range   Fibrinogen 397 210 - 470 mg/dL  Protime-INR     Status: Abnormal   Collection Time: 01/04/15  8:29 AM  Result Value Ref Range   Prothrombin Time 24.8 (H) 11.4 - 15.0 seconds   INR 2.23   APTT     Status:  Abnormal   Collection Time: 01/04/15  8:29 AM  Result Value Ref Range   aPTT 43 (H) 24 - 36 seconds  Prepare RBC     Status: None   Collection Time: 01/04/15  8:54 AM  Result Value Ref Range   Order Confirmation      ORDER PROCESSED BY BLOOD BANK ORDER IS NOT EMERGENT BUT IS STAT. AUTOLOGOUS/ DIRECTED NOT INDICATED. ERROR IN ORDERING. C/BRITTANY KILLINGSWORTH 01/04/15 0900 [facility]  Prepare fresh frozen plasma     Status: None (Preliminary result)   Collection Time: 01/04/15  9:02 AM  Result Value Ref Range   Unit Number [id number]    Blood Component Type THAWED PLASMA    Unit division 00    Status of Unit ISSUED    Transfusion Status OK TO TRANSFUSE     Cultures: Results for orders placed or performed during the hospital encounter of 01/02/15  MRSA PCR Screening     Status: None   Collection Time: 01/04/15  8:15 AM  Result Value Ref Range Status   MRSA by PCR NEGATIVE NEGATIVE Final    Comment:        The GeneXpert MRSA Assay (FDA approved for NASAL specimens only), is one component of a comprehensive MRSA colonization surveillance program. It is not intended to diagnose MRSA infection nor to guide or monitor treatment for MRSA infections.     Imaging:  Assessment   54 y.o. with acute vaginal bleeding in setting up elevated  INR  Plan   - appreciated medicine help with correction of INR - If INR correct and vaginal bleeding sufficiently decreased will start to incrementally deflate Bakri baloon.

## 2015-01-04 NOTE — Progress Notes (Signed)
Dr ward notified of pt still actively bleeding, bp 86/53, pule 120. Orders given

## 2015-01-04 NOTE — Progress Notes (Signed)
[facility] [place] at [facility]   PATIENT NAME: Brittany Madden    MR#:  [id number]  DATE OF BIRTH:  [birth year]  SUBJECTIVE: Consulted secondary to hypotension. 54 year old female patient with vaginal bleeding admitted to OB/GYN service. Patient found to have bilateral laceration and is supposed to have a Fixed in the operating room but because the contractures, coagulopathy laceration could not be repaired. Patient is a transfer to ICU secondary to hemorrhagic shock. Patient the found to have INR of 2.99. And also hemoglobin dropped to 6.3. Patient received to the vitamin K, 2 units of packed RBC, FFP. She is nonverbal.   CHIEF COMPLAINT:   Chief Complaint  Patient presents with  . GI Bleeding    REVIEW OF SYSTEMS:    Review of Systems  Unable to perform ROS: medical condition  Eyes: Negative for pain.    Nutrition: Tube feeding Tolerating Diet: Tolerating PT:      DRUG ALLERGIES:   Allergies  Allergen Reactions  . Asa [Aspirin] Other (See Comments)    Reaction: unknown  . Keflex [Cephalexin] Other (See Comments)    Reaction: unknown   . Nsaids Other (See Comments)    Reaction: unknown   . Other Other (See Comments)    SEAFOOD  . Salicylates Other (See Comments)    Reaction: unknown    VITALS:  Blood pressure 103/73, pulse 110, temperature 98.5 F (36.9 C), temperature source Axillary, resp. rate 22, height 5\' 5"  (1.651 m), weight 47.174 kg (104 lb), SpO2 99 %.  PHYSICAL EXAMINATION:   Physical Exam  GENERAL:  54 y.o.-year-old patient lying in the bed, pale looking.  EYES: Pupils equal, round, reactive to light and accommodation. No scleral icterus. Extraocular muscles intact.  HEENT: Head atraumatic, normocephalic. Oropharynx and nasopharynx clear.  NECK:  Supple, no jugular venous distention. No thyroid enlargement, no tenderness.  LUNGS: Normal breath sounds bilaterally, no wheezing, rales,rhonchi or crepitation. No use of  accessory muscles of respiration.  CARDIOVASCULAR: S1, S2 normal. No murmurs, rubs, or gallops.  ABDOMEN: Soft, nontender, nondistended. Bowel sounds present. No organomegaly or mass.  EXTREMITIES: Significant contractures of the legs. NEUROLOGIC: Cranial nerves II through XII are intact. Muscle strength 5/5 in all extremities. Sensation intact. Gait not checked.  PSYCHIATRIC: The patient is alert and oriented x 3.  SKIN: Stage II decubitus present.  Vaginal packing present  LABORATORY PANEL:   CBC  Recent Labs Lab 01/04/15 0453  WBC 8.8  HGB 6.3*  HCT 19.2*  PLT 154   ------------------------------------------------------------------------------------------------------------------  Chemistries   Recent Labs Lab 01/02/15 2310 01/02/15 2322  NA 135  --   K 4.4  --   CL 106  --   CO2 22  --   GLUCOSE 100*  --   BUN 13  --   CREATININE <0.30*  --   CALCIUM 9.0  --   AST  --  32  ALT  --  29  ALKPHOS  --  78  BILITOT  --  0.2*   ------------------------------------------------------------------------------------------------------------------  Cardiac Enzymes No results for input(s): TROPONINI in the last 168 hours. ------------------------------------------------------------------------------------------------------------------  RADIOLOGY:  US Transvaginal Non-ob  01/03/2015   CLINICAL DATA:  Vaginal bleeding  EXAM: TRANSABDOMINAL AND TRANSVAGINAL ULTRASOUND OF PELVIS  TECHNIQUE: Both transabdominal and transvaginal ultrasound examinations of the pelvis were performed. Transabdominal technique was performed for global imaging of the pelvis including uterus, ovaries, adnexal regions, and pelvic cul-de-sac. It was necessary to proceed with endovaginal exam following the transabdominal  exam to visualize the ovaries.  COMPARISON:  None  FINDINGS: Uterus  Measurements: 6.9 x 3.5 x 4.0 cm. No fibroids or other mass visualized.  Endometrium  Thickness: 4.8 mm.  No focal  abnormality visualized.  Right ovary  Measurements: 2.7 x 1.6 x 1.4 cm. Normal appearance/no adnexal mass.  Left ovary  Measurements: 3.8 x 3.1 x 3.2 cm. Normal appearance/no adnexal mass.  Other findings  No free fluid.  IMPRESSION: No significant abnormality   Electronically Signed   By: Ellery Plunk M.D.   On: 01/03/2015 02:57   US Pelvis Complete  01/03/2015   CLINICAL DATA:  Vaginal bleeding  EXAM: TRANSABDOMINAL AND TRANSVAGINAL ULTRASOUND OF PELVIS  TECHNIQUE: Both transabdominal and transvaginal ultrasound examinations of the pelvis were performed. Transabdominal technique was performed for global imaging of the pelvis including uterus, ovaries, adnexal regions, and pelvic cul-de-sac. It was necessary to proceed with endovaginal exam following the transabdominal exam to visualize the ovaries.  COMPARISON:  None  FINDINGS: Uterus  Measurements: 6.9 x 3.5 x 4.0 cm. No fibroids or other mass visualized.  Endometrium  Thickness: 4.8 mm.  No focal abnormality visualized.  Right ovary  Measurements: 2.7 x 1.6 x 1.4 cm. Normal appearance/no adnexal mass.  Left ovary  Measurements: 3.8 x 3.1 x 3.2 cm. Normal appearance/no adnexal mass.  Other findings  No free fluid.  IMPRESSION: No significant abnormality   Electronically Signed   By: Ellery Plunk M.D.   On: 01/03/2015 02:57     ASSESSMENT AND PLAN:   Active Problems:   Vaginal bleeding   Chronic anticoagulation   History of DVT (deep vein thrombosis)   History of pulmonary embolus (PE)   Pressure ulcer   Anemia   #1 hemorrhagic shock secondary to profuse vaginal bleeding: Continue volume resuscitation with the blood transfusions, serial hemoglobin checks, IV fluids.  #2 h/o dvt;on coumadin; now has coagulopathy and vaginal bleeding , off the Coumadin now.  #3 coagulopathy: Reversed with vitamin K, FFP.,blood trasnfsusion  #4: Vaginal bleeding due to vaginal tear. unable to repair in the OR, GYN is the discussing with vascular  regarding vascular occlusion;placed a Bakri balloon intravaginally, with 120cc of NS in balloon. Foley catheter remains in place and draining blood tinged fluid.   Family is informed by GYN. History of a  multiple sclerosis with contractures she does have a tube feedings and we ordered that to feedings. Restart her home meds  Condition is critical, high risk for cardiac arrest.  All the records are reviewed and case discussed with Care Management/Social Workerr. Management plans discussed with the patient, family and they are in agreement.  CODE STATUS: dnr  TOTAL TIME TAKING CARE OF THIS PATIENT:35  minutes.   Unable decide disposition because she is critically ill at this time   [facility] M.D on 01/04/2015 at 11:29 AM  Between 7am to 6pm - Pager - [phone]  After 6pm go to [email or web address] - password [email or web address] [facility]  [place] [facility]  Office  [phone]  CC: Primary care physician; Manus Rudd LEE, DO
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
## 2015-01-04 NOTE — Progress Notes (Signed)
Dr Elesa Massed notified of pt"s bleeding increased heart rate, HGB 7.0. Stated would put orders in.

## 2015-01-04 NOTE — Progress Notes (Signed)
Paged/Called Dr. Bonney Aid in regards to pt's heart rate sustaining in 130's. Pt is currently afebrile. Hgb is improved at 9.6. He stated to give pt ordered fentanyl even with pt stating she is not in pain. Also stated to give ordered unit of RBCs that we were currently holding awaiting CBC results. Brittany Macleod, RN

## 2015-01-05 LAB — TYPE AND SCREEN
ABO/RH(D): O POS
Antibody Screen: NEGATIVE
UNIT DIVISION: 0
UNIT DIVISION: 0
UNIT DIVISION: 0
Unit division: 0
Unit division: 0

## 2015-01-05 LAB — BASIC METABOLIC PANEL
Anion gap: 5 (ref 5–15)
BUN: 6 mg/dL (ref 6–20)
CHLORIDE: 111 mmol/L (ref 101–111)
CO2: 24 mmol/L (ref 22–32)
Calcium: 8.5 mg/dL — ABNORMAL LOW (ref 8.9–10.3)
GLUCOSE: 93 mg/dL (ref 65–99)
Potassium: 3.6 mmol/L (ref 3.5–5.1)
Sodium: 140 mmol/L (ref 135–145)

## 2015-01-05 LAB — CBC
HCT: 30.4 % — ABNORMAL LOW (ref 35.0–47.0)
HEMOGLOBIN: 10 g/dL — AB (ref 12.0–16.0)
MCH: 28 pg (ref 26.0–34.0)
MCHC: 33 g/dL (ref 32.0–36.0)
MCV: 84.8 fL (ref 80.0–100.0)
Platelets: 145 10*3/uL — ABNORMAL LOW (ref 150–440)
RBC: 3.58 MIL/uL — ABNORMAL LOW (ref 3.80–5.20)
RDW: 14.8 % — AB (ref 11.5–14.5)
WBC: 11.3 10*3/uL — AB (ref 3.6–11.0)

## 2015-01-05 LAB — GLUCOSE, CAPILLARY
GLUCOSE-CAPILLARY: 86 mg/dL (ref 65–99)
Glucose-Capillary: 88 mg/dL (ref 65–99)
Glucose-Capillary: 91 mg/dL (ref 65–99)
Glucose-Capillary: 137 mg/dL — ABNORMAL HIGH (ref 65–99)

## 2015-01-05 LAB — PREPARE FRESH FROZEN PLASMA: Unit division: 0

## 2015-01-05 MED ORDER — FREE WATER
200.0000 mL | Freq: Two times a day (BID) | Status: DC
  Administered 2015-01-05 – 2015-01-09 (×8): 200 mL

## 2015-01-05 MED ORDER — SODIUM CHLORIDE 0.9 % IV BOLUS (SEPSIS)
1000.0000 mL | Freq: Once | INTRAVENOUS | Status: AC
  Administered 2015-01-05: 1000 mL via INTRAVENOUS

## 2015-01-05 NOTE — Progress Notes (Signed)
Obstetric and Gynecology  POD/HD # 3  Subjective  Patient doing well, has no complaints.  Denies pain or discomfort.    Denies CP, SOB, F/C, N/V/D, or leg pain.   Objective  Objective:   Filed Vitals:   01/05/15 0700 01/05/15 0715 01/05/15 0800 01/05/15 0900  BP: 83/49  Pulse: 103 100 104 114  Temp:  98.4 F (36.9 C)    TempSrc:  Axillary    Resp: Height:      Weight:      SpO2: 96% 97% 96% 97%   Temp:  [98.3 F (36.8 C)-99 F (37.2 C)] 98.4 F (36.9 C) (08/02 0715) Pulse Rate:  [100-132] 114 (08/02 0900) Resp:  [0-32] 19 (08/02 0900) BP: (76-132)/(49-93) 83/49 mmHg (08/02 0900) SpO2:  [87 %-100 %] 97 % (08/02 0900) Weight:  [46.5 kg (102 lb 8.2 oz)] 46.5 kg (102 lb 8.2 oz) (08/02 0635) I/O last 3 completed shifts: In: 4082.1 [P.O.:140; I.V.:2098.3; Blood:1370.8; NG/GT:473] Out: 2205.5 [Urine:2200; Other:5.5]   General: NAD, emaciated Cardiovascular: RRR, no murmurs Pulmonary: CTAB, normal respiratory effort Abdomen: Benign. Non-tender, +BS, no guarding.  Extremities: Conracted, No erythema or cords, no calf tenderness, with normal peripheral pulses. Vagina: Bakri Balloon in place, some blood on pad, but significantly less than prior.  Drained 20cc more out of balloon.  Labs: Results for orders placed or performed during the hospital encounter of 01/02/15 (from the past 24 hour(s))  CBC     Status: Abnormal   Collection Time: 01/04/15  3:32 PM  Result Value Ref Range   WBC 10.7 3.6 - 11.0 K/uL   RBC 3.33 (L) 3.80 - 5.20 MIL/uL   Hemoglobin 9.5 (L) 12.0 - 16.0 g/dL   HCT 40.9 (L) 81.1 - 91.4 %   MCV 85.0 80.0 - 100.0 fL   MCH 28.6 26.0 - 34.0 pg   MCHC 33.7 32.0 - 36.0 g/dL   RDW 78.2 (H) 95.6 - 21.3 %   Platelets 140 (L) 150 - 440 K/uL  Protime-INR     Status: Abnormal   Collection Time: 01/04/15  3:32 PM  Result Value Ref Range   Prothrombin Time 18.1 (H) 11.4 - 15.0 seconds   INR 1.48   APTT     Status: None   Collection Time: 01/04/15  3:32 PM  Result Value Ref Range   aPTT 36 24 - 36 seconds  Glucose, capillary     Status: None   Collection Time: 01/04/15  8:13 PM  Result Value Ref Range   Glucose-Capillary 77 65 - 99 mg/dL  Glucose, capillary     Status: Abnormal   Collection Time: 01/04/15 11:45 PM  Result Value Ref Range   Glucose-Capillary 118 (H) 65 - 99 mg/dL  Glucose, capillary     Status: None   Collection Time: 01/05/15  4:34 AM  Result Value Ref Range   Glucose-Capillary 86 65 - 99 mg/dL   Comment 1 Notify RN   CBC     Status: Abnormal   Collection Time: 01/05/15  5:08 AM  Result Value Ref Range   WBC 11.3 (H) 3.6 - 11.0 K/uL   RBC 3.58 (L) 3.80 - 5.20 MIL/uL   Hemoglobin 10.0 (L) 12.0 - 16.0 g/dL   HCT 08.6 (L) 57.8 - 46.9 %   MCV 84.8 80.0 - 100.0 fL   MCH 28.0 26.0 - 34.0 pg   MCHC 33.0 32.0 - 36.0 g/dL   RDW 62.9 (H) 52.8 -  14.5 %   Platelets 145 (L) 150 - 440 K/uL  Basic metabolic panel     Status: Abnormal   Collection Time: 01/05/15  5:08 AM  Result Value Ref Range   Sodium 140 135 - 145 mmol/L   Potassium 3.6 3.5 - 5.1 mmol/L   Chloride 111 101 - 111 mmol/L   CO2 24 22 - 32 mmol/L   Glucose, Bld 93 65 - 99 mg/dL   BUN 6 6 - 20 mg/dL   Creatinine, Ser <1.61 (L) 0.44 - 1.00 mg/dL   Calcium 8.5 (L) 8.9 - 10.3 mg/dL   GFR calc non Af Amer NOT CALCULATED >60 mL/min   GFR calc Af Amer NOT CALCULATED >60 mL/min   Anion gap 5 5 - 15  Glucose, capillary     Status: None   Collection Time: 01/05/15  7:23 AM  Result Value Ref Range   Glucose-Capillary 88 65 - 99 mg/dL      Assessment   53 y.o. With vaginal bleeding s/p repair of laceration, with acute hemorrhage and ICU admission for hemorrhagic shock. Hospital Day: 4   Plan   1. Vaginal bleeding:  After repair of laceration, application of bakri balloon, and reversal of anticoagulation, the patient's vaginal bleeding seems to be better controlled.  I removed 20cc of saline from the balloon, for a total  remaining of 40cc. I will check the pads later this afternoon.  It is still uncertain if this was the source of the blood at her initial presentation, or if it was caused during her evaluation in the ED/Ultrasound/OR.  Regardless, patient to stay in ICU until bleeding has been controlled with reversal and tamponade.  Removing pressure slowly and incrementally, and would like to continue observation for 24 hours after removal (this can be out of the ICU if medially cleared to return to the floor).  2. Hypovolemic shock:  Seemingly recovered/recovering, although still tachy and hypotensive.  S/p 3uPRBCs and 1uFFP with improvement of Hb from 6.3--> 10.0 and INR from 2.98 --> 1,48.  Continue ICU care until medically stable and bleeding stable.    3. FEN: on NS KVO, and resumed 1400kcal tube feeds. Bolus NS or LR if remains tachy/hypotensive.  4. Thank you for ICU and hospitalist team for their assistance in Ms. Bocek' care.    ----- Ranae Plumber, MD Attending Obstetrician and Gynecologist [facility]     .

## 2015-01-05 NOTE — Progress Notes (Signed)
Nutrition Follow-up   INTERVENTION:  EN: recommend continuing current TF regimen, continue Prostat; recommend adding free water flush of 200 mL BID   NUTRITION DIAGNOSIS:   Inadequate oral intake related to inability to eat, acute illness as evidenced by NPO status. Being addressed via TF  GOAL:   Patient will meet greater than or equal to 90% of their needs   MONITOR:    (EN, Digestive System, Electrolyte/Renal Profile, Glucose Profile, Anthropometrics)  REASON FOR ASSESSMENT:   Consult Enteral/tube feeding initiation and management  ASSESSMENT:     Diet Order:  Diet NPO time specified   EN: tolerating bolus feedings of Jevity 1.2 1 can 4 times daily  Digestive System: no signs of TF intolerance  Skin:   (stage II/III pressure ulcer on buttock)  Last BM:  +small BM 8/1   Meds: NS at 50 ml/hr  Electrolyte and Renal Profile:  Recent Labs Lab 01/02/15 2310 01/05/15 0508  BUN 13 6  CREATININE <0.30* <0.30*  NA 135 140  K 4.4 3.6   Glucose Profile:  Recent Labs  01/04/15 2345 01/05/15 0434 01/05/15 0723  GLUCAP 118* 86 88   Protein Profile:  Recent Labs Lab 01/02/15 2322  ALBUMIN 3.3*     Height:   Ht Readings from Last 1 Encounters:  01/02/15 5\' 5"  (1.651 m)    Weight:   Wt Readings from Last 1 Encounters:  01/05/15 102 lb 8.2 oz (46.5 kg)    Ideal Body Weight:     BMI:  Body mass index is 17.06 kg/(m^2).  Estimated Nutritional Needs:   Kcal:  1435-1694 kcals (BEE 1086, 1.2 AF, 1.1-1.3 IF)  Protein:  61-75 g (1.3-1.6 g/kg)   Fluid:  1410-1645 mL (30-35 ml/kg)   EDUCATION NEEDS:   MODERATE Care Level  Garvin MS, RD, LDN [phone] Pager

## 2015-01-05 NOTE — Anesthesia Postprocedure Evaluation (Signed)
  Anesthesia Post-op Note  Patient: Brittany Madden  Procedure(s) Performed: Procedure(s): EXAM UNDER ANESTHESIA, repair of vaginal laceration  (Right)  Anesthesia type:MAC  Patient location: PACU  Post pain: Pain level controlled  Post assessment: Post-op Vital signs reviewed, Patient's Cardiovascular Status Stable, Respiratory Function Stable, Patent Airway and No signs of Nausea or vomiting  Post vital signs: Reviewed and stable  Last Vitals:  Filed Vitals:   01/05/15 1200  BP: 89/58  Pulse: 100  Temp:   Resp: 19    Level of consciousness: awake, alert  and patient cooperative  Complications: No apparent anesthesia complications

## 2015-01-05 NOTE — Progress Notes (Signed)
Pt resting, no apparent signs of pain or SOB.  Scant traces of blood from vaginal area noted today.  Currently 16mL of fluid remain in vaginal balloon.  Plan is for GYN Dr to come around 10 tonight and take remainder of fluid out and remove balloon.

## 2015-01-05 NOTE — Progress Notes (Signed)
[facility] Physicians - [place] at [facility]   PATIENT NAME: Brittany Madden    MR#:  [id number]  DATE OF BIRTH:  [birth year]  SUBJECTIvel;seen at bed side.bleeding from vagina seems to have stopped.  CHIEF COMPLAINT:   Chief Complaint  Patient presents with  . GI Bleeding    REVIEW OF SYSTEMS:    Review of Systems  Unable to perform ROS: medical condition  Eyes: Negative for pain.    Nutrition: Tube feeding Tolerating Diet: Tolerating PT:      DRUG ALLERGIES:   Allergies  Allergen Reactions  . Asa [Aspirin] Other (See Comments)    Reaction: unknown  . Keflex [Cephalexin] Other (See Comments)    Reaction: unknown   . Nsaids Other (See Comments)    Reaction: unknown   . Other Other (See Comments)    SEAFOOD  . Salicylates Other (See Comments)    Reaction: unknown    VITALS:  Blood pressure 100/69, pulse 104, temperature 98.4 F (36.9 C), temperature source Axillary, resp. rate 22, height 5\' 5"  (1.651 m), weight 46.5 kg (102 lb 8.2 oz), SpO2 99 %.  PHYSICAL EXAMINATION:   Physical Exam  GENERAL:  54 y.o.-year-old patient lying in the bed, pale looking.  EYES: Pupils equal, round, reactive to light and accommodation. No scleral icterus. Extraocular muscles intact.  HEENT: Head atraumatic, normocephalic. Oropharynx and nasopharynx clear.  NECK:  Supple, no jugular venous distention. No thyroid enlargement, no tenderness.  LUNGS: Normal breath sounds bilaterally, no wheezing, rales,rhonchi or crepitation. No use of accessory muscles of respiration.  CARDIOVASCULAR: S1, S2 normal. No murmurs, rubs, or gallops.  ABDOMEN: Soft, nontender, nondistended. Bowel sounds present. No organomegaly or mass.  EXTREMITIES: Significant contractures of the legs. NEUROLOGIC: Cranial nerves II through XII are intact. Muscle strength 5/5 in all extremities. Sensation intact. Gait not checked.  PSYCHIATRIC: The patient is alert and oriented x 3.  SKIN: Stage II  decubitus present.  Vaginal packing present  LABORATORY PANEL:   CBC  Recent Labs Lab 01/05/15 0508  WBC 11.3*  HGB 10.0*  HCT 30.4*  PLT 145*   ------------------------------------------------------------------------------------------------------------------  Chemistries   Recent Labs Lab 01/02/15 2322 01/05/15 0508  NA  --  140  K  --  3.6  CL  --  111  CO2  --  24  GLUCOSE  --  93  BUN  --  6  CREATININE  --  <0.30*  CALCIUM  --  8.5*  AST 32  --   ALT 29  --   ALKPHOS 78  --   BILITOT 0.2*  --    ------------------------------------------------------------------------------------------------------------------  Cardiac Enzymes No results for input(s): TROPONINI in the last 168 hours. ------------------------------------------------------------------------------------------------------------------  RADIOLOGY:  No results found.   ASSESSMENT AND PLAN:   Active Problems:   Vaginal bleeding   Chronic anticoagulation   History of DVT (deep vein thrombosis)   History of pulmonary embolus (PE)   Pressure ulcer   Anemia   #1 hemorrhagic shock secondary to profuse vaginal bleeding: s/p multiple units  Blood,FFP transfusion.HB stable,  #2 h/o dvt;on coumadin; now has coagulopathy and vaginal bleeding , off the Coumadin now.  #3 coagulopathy: Reversed with vitamin K, FFP.,blood trasnfsusion  #4: Vaginal bleeding due to vaginal tear. unable to repair in the OR, GYN is the discussing with vascular regarding vascular occlusion;placed a Bakri balloon intravaginally, with 120cc of NS in balloon. Foley catheter remains in place and draining blood tinged fluid.   Family is informed  by GYN. History of a  multiple sclerosis with contractures she does have a tube feedings and we ordered that to feedings. Restart her home meds  Condition is critical, high risk for cardiac arrest.  All the records are reviewed and case discussed with Care Management/Social  Workerr. Management plans discussed with the patient, family and they are in agreement.  CODE STATUS: dnr  TOTAL TIME TAKING CARE OF THIS PATIENT:35  minutes.   Unable decide disposition because she is critically ill at this time   [facility] M.D on 01/05/2015 at 3:08 PM  Between 7am to 6pm - Pager - [phone]  After 6pm go to [email or web address] - password [email or web address] [facility]  [place] [facility]  Office  [phone]  CC: Primary care physician; Manus Rudd LEE, DO

## 2015-01-05 NOTE — Progress Notes (Signed)
   01/05/15 1155  Clinical Encounter Type  Visited With Patient and family together  Visit Type Follow-up  Consult/Referral To Chaplain  Spiritual Encounters  Spiritual Needs Emotional  Stress Factors  Family Stress Factors None identified  Chaplain rounded on unit and offered spiritual and emotional support to patient's family as applicable. Chaplain Antonius Hartlage A. Regino Fournet Ext. [phone]

## 2015-01-05 NOTE — Clinical Social Work Note (Signed)
Clinical Social Work Assessment  Patient Details  Name: Brittany Madden MRN: [id number] Date of Birth: 1961/03/14  Date of referral:  01/05/15               Reason for consult:  Facility Placement, Other (Comment Required) (From [facility] )                Permission sought to share information with:  Chartered certified accountant granted to share information::  Yes, Verbal Permission Granted  Name::      [employer]::   [place]   Relationship::     Contact Information:     Housing/Transportation Living arrangements for the past 2 months:  [place] of Information:  [place], Other (Comment Required) (Brother Brittany Madden is HPOA ) Patient Interpreter Needed:  None Criminal Activity/Legal Involvement Pertinent to Current Situation/Hospitalization:  No - Comment as needed Significant Relationships:  Siblings Lives with:  Facility Resident Do you feel safe going back to the place where you live?  Yes Need for family participation in patient care:  Yes (Comment)  Care giving concerns: Patient is a long term care resident at [facility].    Social Worker assessment / plan:  Holiday representative (CSW) received consult that patient is from a facility. CSW met with patient and her brother Brittany Madden [facility]) [phone]) [phone] was at bedside. Patient was laying in the bed asleep and did not participate in assessment. Brittany Madden reported that patient has lived at [facility] for almost 10 years. Brittany Madden reported that he lives in [place] and grew up in [place]. Per Brittany Madden they have another sister Brittany Madden that lives in [place], [place]. Per Brittany Madden was recently in [place] visiting with patient for 2 weeks. Per Brittany Madden their mother passed away 4 years ago. Brittany Madden reported that he would be in [place] at least through the weekend. Brittany Madden is agreeable for patient returning to [facility] when medically stable.   Per Brittany Madden admissions coordinator  at [facility] patient can return when stable. FL2 complete and on chart.   Employment status:  Retired, Disabled (Comment on whether or not currently receiving Disability) Insurance information:  Medicaid [place], [employer] PT Recommendations:  Not assessed at this time Information / Referral to community resources:  [place]  Patient/Family's Response to care: Patient's brother Brittany Madden ([place]) is agreeable for patient to return to [facility].   Patient/Family's Understanding of and Emotional Response to Diagnosis, Current Treatment, and Prognosis: Patient was asleep throughout assessment. Brother was pleasant and thanked CSW for visit.   Emotional Assessment Appearance:  Appears stated age Attitude/Demeanor/Rapport:  Unable to Assess Affect (typically observed):  Unable to Assess Orientation:  Fluctuating Orientation (Suspected and/or reported Sundowners) Alcohol / Substance use:  Not Applicable Psych involvement (Current and /or in the community):  No (Comment)  Discharge Needs  Concerns to be addressed:  Discharge Planning Concerns Readmission within the last 30 days:  No Current discharge risk:  Chronically ill Barriers to Discharge:  Continued Medical Work up   [name], LCSW 01/05/2015, 12:08 PM

## 2015-01-06 LAB — GLUCOSE, CAPILLARY
GLUCOSE-CAPILLARY: 91 mg/dL (ref 65–99)
GLUCOSE-CAPILLARY: 143 mg/dL — AB (ref 65–99)
GLUCOSE-CAPILLARY: 84 mg/dL (ref 65–99)
Glucose-Capillary: 90 mg/dL (ref 65–99)

## 2015-01-06 LAB — CBC
HCT: 31.2 % — ABNORMAL LOW (ref 35.0–47.0)
Hemoglobin: 10.4 g/dL — ABNORMAL LOW (ref 12.0–16.0)
MCH: 28.2 pg (ref 26.0–34.0)
MCHC: 33.2 g/dL (ref 32.0–36.0)
MCV: 85.1 fL (ref 80.0–100.0)
PLATELETS: 158 10*3/uL (ref 150–440)
RBC: 3.67 MIL/uL — AB (ref 3.80–5.20)
RDW: 15.5 % — AB (ref 11.5–14.5)
WBC: 12.2 10*3/uL — AB (ref 3.6–11.0)

## 2015-01-06 MED ORDER — SENNOSIDES-DOCUSATE SODIUM 8.6-50 MG PO TABS
2.0000 | ORAL_TABLET | Freq: Two times a day (BID) | ORAL | Status: DC
  Administered 2015-01-06 – 2015-01-09 (×7): 2 via ORAL
  Filled 2015-01-06 (×7): qty 2

## 2015-01-06 NOTE — Progress Notes (Signed)
   01/06/15 1050  Clinical Encounter Type  Visited With Patient and family together  Visit Type Follow-up  Consult/Referral To Chaplain  Spiritual Encounters  Spiritual Needs Emotional  Stress Factors  Patient Stress Factors None identified  Family Stress Factors None identified  Chaplain rounded in unit and offered a compassionate presence and support to  patient and brother as applicable. Chaplain Alsha Meland A. Khamila Bassinger Ext. [phone]

## 2015-01-06 NOTE — Progress Notes (Signed)
Pt rested well overnight, no co pain or distress, ST, scant blood on pad. GYN did not come last night to remove the balloon. Afebrile, tolerating tube feeds.

## 2015-01-06 NOTE — Progress Notes (Signed)
Report called to receiving Duke Salvia, pt VSS, pt and brother verbalized understanding of tx, all questions answered

## 2015-01-06 NOTE — Progress Notes (Signed)
[facility] Physicians - [place] at [facility]   PATIENT NAME: Brittany Madden    MR#:  [id number]  DATE OF BIRTH:  [birth year]  SUBJECTIvel;seen at bed side.bleeding from vagina seems to have stopped.  CHIEF COMPLAINT:   Chief Complaint  Patient presents with  . GI Bleeding   The patient is feeling better. Brother is at bedside.   REVIEW OF SYSTEMS:    Review of Systems  Unable to perform ROS: medical condition  Eyes: Negative for pain.    Nutrition: Tube feeding Tolerating Diet: Tolerating PT:      DRUG ALLERGIES:   Allergies  Allergen Reactions  . Asa [Aspirin] Other (See Comments)    Reaction: unknown  . Keflex [Cephalexin] Other (See Comments)    Reaction: unknown   . Nsaids Other (See Comments)    Reaction: unknown   . Other Other (See Comments)    SEAFOOD  . Salicylates Other (See Comments)    Reaction: unknown    VITALS:  Blood pressure 98/72, pulse 118, temperature 98.8 F (37.1 C), temperature source Oral, resp. rate 21, height 5\' 5"  (1.651 m), weight 48.6 kg (107 lb 2.3 oz), SpO2 100 %.  PHYSICAL EXAMINATION:   Physical Exam  GENERAL:  54 y.o.-year-old patient lying in the bed, pale looking.  EYES: Pupils equal, round, reactive to light and accommodation. No scleral icterus. Extraocular muscles intact.  HEENT: Head atraumatic, normocephalic. Oropharynx and nasopharynx clear.  NECK:  Supple, no jugular venous distention. No thyroid enlargement, no tenderness.  LUNGS: Normal breath sounds bilaterally, no wheezing, rales,rhonchi or crepitation. No use of accessory muscles of respiration.  CARDIOVASCULAR: S1, S2 normal. No murmurs, rubs, or gallops.  ABDOMEN: Soft, nontender, nondistended. Bowel sounds present. No organomegaly or mass.  EXTREMITIES: Significant contractures of the legs. NEUROLOGIC: Cranial nerves II through XII are intact. Muscle strength 5/5 in all extremities. Sensation intact. Gait not checked.  PSYCHIATRIC: The  patient is alert and oriented x 3.  SKIN: Stage II decubitus present.  Vaginal packing present  LABORATORY PANEL:   CBC  Recent Labs Lab 01/06/15 0953  WBC 12.2*  HGB 10.4*  HCT 31.2*  PLT 158   ------------------------------------------------------------------------------------------------------------------  Chemistries   Recent Labs Lab 01/02/15 2322 01/05/15 0508  NA  --  140  K  --  3.6  CL  --  111  CO2  --  24  GLUCOSE  --  93  BUN  --  6  CREATININE  --  <0.30*  CALCIUM  --  8.5*  AST 32  --   ALT 29  --   ALKPHOS 78  --   BILITOT 0.2*  --    ------------------------------------------------------------------------------------------------------------------  Cardiac Enzymes No results for input(s): TROPONINI in the last 168 hours. ------------------------------------------------------------------------------------------------------------------  RADIOLOGY:  No results found.   ASSESSMENT AND PLAN:   Active Problems:   Vaginal bleeding   Chronic anticoagulation   History of DVT (deep vein thrombosis)   History of pulmonary embolus (PE)   Pressure ulcer   Anemia   #1 hemorrhagic shock secondary to profuse vaginal bleeding:  Better today s/p multiple units  Blood,FFP transfusion.HB stable,  #2 h/o dvt was on coumadin;  off the Coumadin now.  #3 coagulopathy: Reversed with vitamin K, FFP.,blood trasnfsusion  #4: Vaginal bleeding due to vaginal tear. unable to repair in the OR, GYN is the discussing with vascular regarding vascular occlusion;placed a Bakri balloon intravaginally, with 120cc of NS in balloon. Foley catheter remains in place and  draining blood tinged fluid.   Family is informed by GYN. History of a  multiple sclerosis with contractures she does have a tube feedings and we ordered that to feedings. Restart her home meds   Okay to transfer to floor from medical standpoint   All the records are reviewed and case discussed with  Care Management/Social Workerr. Management plans discussed with the patient, family and they are in agreement.  CODE STATUS: dnr  TOTAL TIME TAKING CARE OF THIS PATIENT:35  minutes.   Unable decide disposition because she is critically ill at this time   Ramonita Lab M.D on 01/06/2015 at 2:34 PM  Between 7am to 6pm - Pager - [phone]  After 6pm go to [email or web address] - password [email or web address] [facility]  [place] [facility]  Office  [phone]  CC: Primary care physician; Manus Rudd LEE, DO

## 2015-01-06 NOTE — Progress Notes (Signed)
Obstetric and Gynecology  POD/HD # 5  Subjective  Patient doing well, has no complaints.  Denies pain or discomfort.    Denies CP, SOB, F/C, N/V/D, or leg pain.   Objective  Objective:  98.7 / 73 / 18 / 106/78 100% RA / Tele: NSR  General: NAD, emaciated Cardiovascular: RRR, no murmurs Pulmonary: CTAB, normal respiratory effort Abdomen: Benign. Non-tender, +BS, no guarding.  Extremities: Conracted, No erythema or cords, no calf tenderness, with normal peripheral pulses. Vagina: Bakri Balloon in place, no blood on pad; recently changed.  Drained 20cc more out of balloon - no saline in balloon any longer; to remain in place for now.  AM labs pending   Assessment   54 y.o. With vaginal bleeding s/p repair of laceration, with acute hemorrhage and ICU admission for hemorrhagic shock. Hospital Day: 5   Plan   1. Vaginal bleeding:  After repair of laceration, application of bakri balloon, and reversal of anticoagulation, the patient's vaginal bleeding seems to be better controlled.  I removed 20cc of saline from the balloon, for a total remaining of 0cc. Continue pad checks.   **It is still uncertain if this was the source of the blood at her initial presentation, or if it was caused during her evaluation in the ED/Ultrasound/OR.  May be transferred back to floor if vitals stay stable and WNL, with no increased bleeding noted.    2. Hypovolemic shock:  Seemingly recovered/recovering, tachy and hypotension responsive to NS bolus.  S/p 3uPRBCs and 1uFFP with improvement of Hb from 6.3--> 10.0 and INR from 2.98 --> 1,48.  Continue ICU care until medically stable and bleeding stable.    3. FEN: on NS KVO, and resumed 1400kcal tube feeds. Bolus NS or LR if remains tachy/hypotensive.  4. Thank you for ICU and hospitalist team for their assistance in Ms. Devora' care.    ----- Ranae Plumber, MD Attending Obstetrician and Gynecologist [facility]      .

## 2015-01-06 NOTE — Care Management Important Message (Signed)
Important Message  Patient Details  Name: Brittany Madden MRN: [id number] Date of Birth: [birth year]   Medicare Important Message Given:  Yes-second notification given    [name] 01/06/2015, 10:23 AM

## 2015-01-07 LAB — CBC
HCT: 32.4 % — ABNORMAL LOW (ref 35.0–47.0)
HEMOGLOBIN: 10.7 g/dL — AB (ref 12.0–16.0)
MCH: 28.7 pg (ref 26.0–34.0)
MCHC: 33.1 g/dL (ref 32.0–36.0)
MCV: 86.8 fL (ref 80.0–100.0)
Platelets: 191 10*3/uL (ref 150–440)
RBC: 3.73 MIL/uL — ABNORMAL LOW (ref 3.80–5.20)
RDW: 15.2 % — ABNORMAL HIGH (ref 11.5–14.5)
WBC: 8.9 10*3/uL (ref 3.6–11.0)

## 2015-01-07 LAB — GLUCOSE, CAPILLARY
GLUCOSE-CAPILLARY: 114 mg/dL — AB (ref 65–99)
Glucose-Capillary: 110 mg/dL — ABNORMAL HIGH (ref 65–99)
Glucose-Capillary: 120 mg/dL — ABNORMAL HIGH (ref 65–99)
Glucose-Capillary: 80 mg/dL (ref 65–99)
Glucose-Capillary: 91 mg/dL (ref 65–99)
Glucose-Capillary: 99 mg/dL (ref 65–99)

## 2015-01-07 MED ORDER — HEPARIN SODIUM (PORCINE) 5000 UNIT/ML IJ SOLN
5000.0000 [IU] | Freq: Three times a day (TID) | INTRAMUSCULAR | Status: DC
  Administered 2015-01-07 – 2015-01-09 (×5): 5000 [IU] via SUBCUTANEOUS
  Filled 2015-01-07 (×5): qty 1

## 2015-01-07 MED ORDER — ESCITALOPRAM OXALATE 10 MG PO TABS
10.0000 mg | ORAL_TABLET | Freq: Every day | ORAL | Status: DC
  Administered 2015-01-08 – 2015-01-09 (×2): 10 mg
  Filled 2015-01-07 (×2): qty 1

## 2015-01-07 MED ORDER — PANTOPRAZOLE SODIUM 40 MG PO PACK
40.0000 mg | PACK | Freq: Every day | ORAL | Status: DC
  Administered 2015-01-07 – 2015-01-09 (×3): 40 mg
  Filled 2015-01-07 (×3): qty 20

## 2015-01-07 NOTE — Progress Notes (Signed)
[facility] Physicians - [place] at [facility]   PATIENT NAME: Brittany Madden    MR#:  [id number]  DATE OF BIRTH:  05-16-1961  SUBJECTIvel;seen at bed side.bleeding from vagina seems to have stopped.  CHIEF COMPLAINT:   Chief Complaint  Patient presents with  . GI Bleeding   The patient is feeling better. No other episodes of profuse vaginal bleeding. Brother is at bedside.   REVIEW OF SYSTEMS:    Review of Systems  Unable to perform ROS: medical condition  Eyes: Negative for pain.    Nutrition: Tube feeding Tolerating Diet: Tolerating PT:      DRUG ALLERGIES:   Allergies  Allergen Reactions  . Asa [Aspirin] Other (See Comments)    Reaction: unknown  . Keflex [Cephalexin] Other (See Comments)    Reaction: unknown   . Nsaids Other (See Comments)    Reaction: unknown   . Other Other (See Comments)    SEAFOOD  . Salicylates Other (See Comments)    Reaction: unknown    VITALS:  Blood pressure 116/68, pulse 88, temperature 98.4 F (36.9 C), temperature source Oral, resp. rate 18, height  (1.651 m), weight 47.9 kg (105 lb 9.6 oz), SpO2 98 %.  PHYSICAL EXAMINATION:   Physical Exam  GENERAL:  54 y.o.-year-old patient lying in the bed, pale looking.  EYES: Pupils equal, round, reactive to light and accommodation. No scleral icterus. Extraocular muscles intact.  HEENT: Head atraumatic, normocephalic. Oropharynx and nasopharynx clear.  NECK:  Supple, no jugular venous distention. No thyroid enlargement, no tenderness.  LUNGS: Normal breath sounds bilaterally, no wheezing, rales,rhonchi or crepitation. No use of accessory muscles of respiration.  CARDIOVASCULAR: S1, S2 normal. No murmurs, rubs, or gallops.  ABDOMEN: Soft, nontender, nondistended. Bowel sounds present. No organomegaly or mass.  EXTREMITIES: Significant contractures of the legs. NEUROLOGIC: Cranial nerves II through XII are intact. Muscle strength 5/5 in all extremities. Sensation  intact. Gait not checked.  PSYCHIATRIC: The patient is alert and oriented x 3.  SKIN: Stage II decubitus present.  Vaginal packing present  LABORATORY PANEL:   CBC  Recent Labs Lab 01/07/15 0320  WBC 8.9  HGB 10.7*  HCT 32.4*  PLT 191   ------------------------------------------------------------------------------------------------------------------  Chemistries   Recent Labs Lab 01/02/15 2322 01/05/15 0508  NA  --  140  K  --  3.6  CL  --  111  CO2  --  24  GLUCOSE  --  93  BUN  --  6  CREATININE  --  <0.30*  CALCIUM  --  8.5*  AST 32  --   ALT 29  --   ALKPHOS 78  --   BILITOT 0.2*  --    ------------------------------------------------------------------------------------------------------------------  Cardiac Enzymes No results for input(s): TROPONINI in the last 168 hours. ------------------------------------------------------------------------------------------------------------------  RADIOLOGY:  No results found.   ASSESSMENT AND PLAN:   Active Problems:   Vaginal bleeding   Chronic anticoagulation   History of DVT (deep vein thrombosis)   History of pulmonary embolus (PE)   Pressure ulcer   Anemia   #1 hemorrhagic shock secondary to profuse vaginal bleeding:  Better today s/p multiple units  Blood,FFP transfusion.HB stable at 10.7 today  #2 h/o dvt was on coumadin;  off the Coumadin  Provide DVT prophylaxis with SCDs  #3 coagulopathy: Improved  with vitamin K, FFP.,blood trasnfsusion  #4: Vaginal bleeding due to vaginal tear. unable to repair in the OR, status post Bakri balloon intravaginally, with 120cc of NS  in balloon, clinically improved  Family is informed by GYN. History of a  multiple sclerosis with contractures she does have a tube feedings and we ordered that to feedings. Restart alled  her home meds   Family is requesting placement to a different facility   patient is clinically stable, will sign off   All the records  are reviewed and case discussed with Care Management/Social Workerr. Management plans discussed with the patient, family and they are in agreement.  CODE STATUS: dnr  TOTAL TIME TAKING CARE OF THIS PATIENT:25  minutes.     Ramonita Lab M.D on 01/07/2015 at 2:49 PM  Between 7am to 6pm - Pager - [phone]  After 6pm go to [email or web address] - password [email or web address] [facility]  [place] Hospital[facility]  Office  [phone]  CC: Primary care physician; Manus Rudd LEE, DO

## 2015-01-07 NOTE — Progress Notes (Signed)
Nutrition Follow-up   INTERVENTION:   EN: recommend continuing current TF regimen Nutrition related medication management: last BM on 8/1; if continues without BM, recommend modifying bowel regimen  NUTRITION DIAGNOSIS:   Inadequate oral intake related to inability to eat, acute illness as evidenced by NPO status. Being addressed via TF   GOAL:   Patient will meet greater than or equal to 90% of their needs   MONITOR:    (EN, Digestive System, Electrolyte/Renal Profile, Glucose Profile, Anthropometrics)  ASSESSMENT:    Diet Order:  Diet NPO time specified   EN: tolerating bolus feedings of Jevity 1.5 1 can 4 times daily plus Prostat  Digestive system: BS active, abdomen soft; no signs of TF intolerance  Skin:   (stage II/III pressure ulcer on buttock)  Last BM: +moderate BM   Meds: senokot, NS at 50 ml/hr with Kcl   Glucose Profile:   Recent Labs  01/07/15 0725 01/07/15 1219 01/07/15 1559  GLUCAP 120* 91 114*   Electrolyte and Renal Profile: no BMP daily (last one 8/2)  Recent Labs Lab 01/02/15 2310 01/05/15 0508  BUN 13 6  CREATININE <0.30* <0.30*  NA 135 140  K 4.4 3.6    Height:   Ht Readings from Last 1 Encounters:  01/02/15 5\' 5"  (1.651 m)    Weight:   Wt Readings from Last 1 Encounters:  01/07/15 105 lb 9.6 oz (47.9 kg)    Ideal Body Weight:     BMI:  Body mass index is 17.57 kg/(m^2).  Estimated Nutritional Needs:   Kcal:  1435-1694 kcals (BEE 1086, 1.2 AF, 1.1-1.3 IF)  Protein:  61-75 g (1.3-1.6 g/kg)   Fluid:  1410-1645 mL (30-35 ml/kg)   MODERATE Care Level  Romelle Starcher MS, RD, LDN [phone] Pager

## 2015-01-07 NOTE — Progress Notes (Signed)
Pt's BS check at 04:30 at 61, MD notified and orders to give Jevity feeding early.

## 2015-01-07 NOTE — Progress Notes (Signed)
Was informed by Alvis Lemmings, RN that the Bakri balloon was found out of vagina during the night when she went in to turn the pt. Dark blood was found on the pad underneath the pt, but no bright red blood was found. Bakri balloon remains out.

## 2015-01-07 NOTE — Progress Notes (Signed)
Obstetric and Gynecology  Subjective  Doing well.  No further bleeding noted by nursing staff  Objective   Filed Vitals:   01/07/15 0500 01/07/15 0512 01/07/15 0732 01/07/15 0951  BP:  99/61 87/66 116/68  Pulse:  74 85 88  Temp:  97.7 F (36.5 C) 98.4 F (36.9 C)   TempSrc:  Oral Oral   Resp:   18   Height:      Weight: 47.9 kg (105 lb 9.6 oz)     SpO2:  96% 97% 98%     Intake/Output Summary (Last 24 hours) at 01/07/15 1532 Last data filed at 01/07/15 1230  Gross per 24 hour  Intake 2358.99 ml  Output   1025 ml  Net 1333.99 ml    Patient currently sleeping exam deferred  Labs: Results for orders placed or performed during the hospital encounter of 01/02/15 (from the past 24 hour(s))  Glucose, capillary     Status: None   Collection Time: 01/06/15  7:57 PM  Result Value Ref Range   Glucose-Capillary 84 65 - 99 mg/dL  Glucose, capillary     Status: None   Collection Time: 01/07/15 12:34 AM  Result Value Ref Range   Glucose-Capillary 99 65 - 99 mg/dL  CBC     Status: Abnormal   Collection Time: 01/07/15  3:20 AM  Result Value Ref Range   WBC 8.9 3.6 - 11.0 K/uL   RBC 3.73 (L) 3.80 - 5.20 MIL/uL   Hemoglobin 10.7 (L) 12.0 - 16.0 g/dL   HCT 49.6 (L) 75.9 - 16.3 %   MCV 86.8 80.0 - 100.0 fL   MCH 28.7 26.0 - 34.0 pg   MCHC 33.1 32.0 - 36.0 g/dL   RDW 84.6 (H) 65.9 - 93.5 %   Platelets 191 150 - 440 K/uL  Glucose, capillary     Status: None   Collection Time: 01/07/15  4:36 AM  Result Value Ref Range   Glucose-Capillary 80 65 - 99 mg/dL   Comment 1 Notify RN   Glucose, capillary     Status: Abnormal   Collection Time: 01/07/15  7:25 AM  Result Value Ref Range   Glucose-Capillary 120 (H) 65 - 99 mg/dL   Comment 1 Notify RN   Glucose, capillary     Status: None   Collection Time: 01/07/15 12:19 PM  Result Value Ref Range   Glucose-Capillary 91 65 - 99 mg/dL   Comment 1 Notify RN     Cultures: Results for orders placed or performed during the hospital  encounter of 01/02/15  MRSA PCR Screening     Status: None   Collection Time: 01/04/15  8:15 AM  Result Value Ref Range Status   MRSA by PCR NEGATIVE NEGATIVE Final    Comment:        The GeneXpert MRSA Assay (FDA approved for NASAL specimens only), is one component of a comprehensive MRSA colonization surveillance program. It is not intended to diagnose MRSA infection nor to guide or monitor treatment for MRSA infections.     Assessment   54 y.o. with MS and significant contractures currently inpatient for vaginal laceration and subsequent hemorrhage   Plan   1) Vaginal laceration - s/p repair by Dr. Elesa Massed on 7/31, required transfusion of 3 units of packed red blood cells and 1 unit of FFP.  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   - SCD's for now  3) Disposition - will verify has bed at prior facility.  Per brother Jorja Loa was happy at [employer]

## 2015-01-07 NOTE — Consult Note (Signed)
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  Wound type:Pressure ulcer Pressure Ulcer POA: Yes Measurement: 1 cm x 3 cm x 2 cm Wound EUM:PNTI and moist Maceration to wound edges Drainage (amount, consistency, odor) None noted.  Musty odor Periwound:maceration.  Chronic wound  Dressing procedure/placement/frequency:Cleanse wound to right ischium with NS and pat gently dry.  Gently fill wound depth with Iodoform packing strip.  Cover with ABD pad and tape.  Change daily.  Will not follow at this time.  Please re-consult if needed.  Maple Hudson RN BSN CWON Pager [phone]

## 2015-01-08 LAB — GLUCOSE, CAPILLARY
GLUCOSE-CAPILLARY: 124 mg/dL — AB (ref 65–99)
Glucose-Capillary: 110 mg/dL — ABNORMAL HIGH (ref 65–99)
Glucose-Capillary: 128 mg/dL — ABNORMAL HIGH (ref 65–99)
Glucose-Capillary: 85 mg/dL (ref 65–99)
Glucose-Capillary: 87 mg/dL (ref 65–99)
Glucose-Capillary: 87 mg/dL (ref 65–99)
Glucose-Capillary: 91 mg/dL (ref 65–99)

## 2015-01-08 LAB — CBC
HCT: 32.3 % — ABNORMAL LOW (ref 35.0–47.0)
HEMOGLOBIN: 10.8 g/dL — AB (ref 12.0–16.0)
MCH: 28.8 pg (ref 26.0–34.0)
MCHC: 33.3 g/dL (ref 32.0–36.0)
MCV: 86.3 fL (ref 80.0–100.0)
Platelets: 235 10*3/uL (ref 150–440)
RBC: 3.74 MIL/uL — ABNORMAL LOW (ref 3.80–5.20)
RDW: 15.9 % — AB (ref 11.5–14.5)
WBC: 8.4 10*3/uL (ref 3.6–11.0)

## 2015-01-08 NOTE — Progress Notes (Signed)
Obstetric and Gynecology  Subjective  Doing well. Minimal dark blood on chuck noted by nursing staff all day.  Heparin started 5000k TID yesterday with no change in vaginal bleeding.  S/p Bakri balloon.    Objective   Filed Vitals:   01/07/15 2321 01/08/15 0404 01/08/15 0741 01/08/15 1531  BP: 100/70  92/65 85/59  Pulse: 113  93 101  Temp: 98.2 F (36.8 C)  97.9 F (36.6 C) 97.7 F (36.5 C)  TempSrc: Oral  Oral Oral  Resp: 20  16 16   Height:      Weight:  48.263 kg (106 lb 6.4 oz)    SpO2: 94%  96% 99%     Intake/Output Summary (Last 24 hours) at 01/08/15 2108 Last data filed at 01/08/15 2041  Gross per 24 hour  Intake 2737.03 ml  Output   2125 ml  Net 612.03 ml    Patient currently sleeping exam deferred  Labs: Results for orders placed or performed during the hospital encounter of 01/02/15 (from the past 24 hour(s))  Glucose, capillary     Status: Abnormal   Collection Time: 01/08/15 12:18 AM  Result Value Ref Range   Glucose-Capillary 128 (H) 65 - 99 mg/dL  Glucose, capillary     Status: None   Collection Time: 01/08/15  4:07 AM  Result Value Ref Range   Glucose-Capillary 87 65 - 99 mg/dL  CBC     Status: Abnormal   Collection Time: 01/08/15  5:24 AM  Result Value Ref Range   WBC 8.4 3.6 - 11.0 K/uL   RBC 3.74 (L) 3.80 - 5.20 MIL/uL   Hemoglobin 10.8 (L) 12.0 - 16.0 g/dL   HCT 41.3 (L) 24.4 - 01.0 %   MCV 86.3 80.0 - 100.0 fL   MCH 28.8 26.0 - 34.0 pg   MCHC 33.3 32.0 - 36.0 g/dL   RDW 27.2 (H) 53.6 - 64.4 %   Platelets 235 150 - 440 K/uL  Glucose, capillary     Status: None   Collection Time: 01/08/15  6:03 AM  Result Value Ref Range   Glucose-Capillary 91 65 - 99 mg/dL   Comment 1 Notify RN   Glucose, capillary     Status: None   Collection Time: 01/08/15  7:39 AM  Result Value Ref Range   Glucose-Capillary 85 65 - 99 mg/dL   Comment 1 Notify RN   Glucose, capillary     Status: None   Collection Time: 01/08/15 11:15 AM  Result Value Ref Range   Glucose-Capillary 87 65 - 99 mg/dL   Comment 1 Notify RN   Glucose, capillary     Status: Abnormal   Collection Time: 01/08/15  5:08 PM  Result Value Ref Range   Glucose-Capillary 110 (H) 65 - 99 mg/dL    Cultures: Results for orders placed or performed during the hospital encounter of 01/02/15  MRSA PCR Screening     Status: None   Collection Time: 01/04/15  8:15 AM  Result Value Ref Range Status   MRSA by PCR NEGATIVE NEGATIVE Final    Comment:        The GeneXpert MRSA Assay (FDA approved for NASAL specimens only), is one component of a comprehensive MRSA colonization surveillance program. It is not intended to diagnose MRSA infection nor to guide or monitor treatment for MRSA infections.     Assessment   54 y.o. with MS and significant contractures currently inpatient for vaginal laceration and subsequent hemorrhage   Plan  1) Vaginal laceration - s/p repair by Dr. Elesa Massed on 7/31, required transfusion of 3 units of packed red blood cells and 1 unit of FFP.  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 Heparin 5000u TID being administrated now. Consider outpatient Lovenox vs warfarin and whether should be therapeutic vs prophylactic.  To be determined by outpatient primary physician with input from our internal medicine staff.    3) Disposition - likely d/c back to SNF, will need case management to facilitate this.  Per internal med note today, the family may be requesting a new facility.  This is new information to our service and has not been verified.  Will have CM organize tomorrow and arrange transport/admission/etc.  Anticipate tomorrow.  ----- Ranae Plumber, MD Attending Obstetrician and Gynecologist [facility]

## 2015-01-08 NOTE — Discharge Summary (Signed)
Gynecology Physician Postoperative Discharge Summary  Patient ID: Brittany Madden MRN: [id number] DOB/AGE: 06/25/1960 54 y.o.  Admit Date: 01/02/2015 Discharge Date: 01/09/2015  Preoperative Diagnoses: Vaginal bleeding, with full anticoagulation on warfarin.  Procedures: Procedure(s) (LRB): EXAM UNDER ANESTHESIA, repair of vaginal laceration  (Right)  CBC Latest Ref Rng 01/08/2015 01/07/2015 01/06/2015  WBC 3.6 - 11.0 K/uL 8.4 8.9 12.2(H)  Hemoglobin 12.0 - 16.0 g/dL 10.8(L) 10.7(L) 10.4(L)  Hematocrit 35.0 - 47.0 % 32.3(L) 32.4(L) 31.2(L)  Platelets 150 - 440 K/uL 235 191 158    Hospital Course:  Brittany Madden is a 54 y.o. No obstetric history on file.  Admitted for anemia and vaginal bleeding. She underwent the procedures as mentioned above. For further details about surgery, please refer to the operative report. Patient a postoperative course of continued vaginal bleeding, s/p transfusion of 3uPRBCs and 1uFFP, reversal of anticoagulation with Vitamin K x2 and the FFP, hemorrhagic shock and transfer to the ICU with placement of Bakri Balloon intervaginally.  Resolution of bleeding with these measures was confirmed and she was transferred back to the med-surg floor.  Anti-coagulation was restarted with Heparin 5000u TID and no further bleeding was noted. Case Management assisted in placing patient to the SNF of [facility]. By time of discharge on POD# 7, she was deemed stable for discharge to home.   It should be noted that the original source of bleeding was thought to be GI, and once found negative, was then thought to be vaginal/uterine.  Due to patient's physical status, complete pelvic exam was not possible.  It is uncertain if her bleeding was from the identified laceration or if this was iatrogenic from multiple exams prior to the EUA.  It is very possible that her initial bleeding is from another source than the laceration, and thus remains undiagnosed.  Discharge Exam: Blood pressure  81/57, pulse 101, temperature 97.9 F (36.6 C), temperature source Oral, resp. rate 24, height  (1.651 m), weight 48.172 kg (106 lb 3.2 oz), SpO2 95 %. General appearance: alert and no distress  Resp: clear to auscultation bilaterally, normal respiratory effort Cardio: regular rate and rhythm  GI: soft, non-tender; bowel sounds normal; no masses, no organomegaly.  Pelvic: scant blood on pad  Extremities: contracted, no sign of DVT  Discharged Condition: Stable  Disposition: discharged to SNF      Medication List    ASK your doctor about these medications        baclofen 20 MG tablet  Commonly known as:  LIORESAL  20 mg by Feeding Tube route every 6 (six) hours.     escitalopram 5 MG/5ML solution  Commonly known as:  LEXAPRO  10 mg by Feeding Tube route daily.     gabapentin 250 MG/5ML solution  Commonly known as:  NEURONTIN  Place 600 mg into feeding tube 3 (three) times daily.     lactulose 10 GM/15ML solution  Commonly known as:  CHRONULAC  Take 20 g by mouth at bedtime.     mineral oil-hydrophilic petrolatum ointment  Apply 1 application topically daily. To feet and legs.     omeprazole 20 MG capsule  Commonly known as:  PRILOSEC  20 mg by Feeding Tube route every morning.     potassium chloride SA 20 MEQ tablet  Commonly known as:  K-DUR,KLOR-CON  20 mEq by Feeding Tube route daily. Dissolve in water then give via g-tube.     REFRESH TEARS 0.5 % Soln  Generic drug:  carboxymethylcellulose  Place 1 drop into both eyes 2 (two) times daily.     warfarin 6 MG tablet  Commonly known as:  COUMADIN  6 mg by Feeding Tube route daily. Every Monday, Wednesday and Friday.     warfarin 4 MG tablet  Commonly known as:  COUMADIN  4 mg by Feeding Tube route daily. Give along with 2.5mg  tab every Sunday, Tuesday, Thursday and Saturday mornings, equaling 6.5mg      warfarin 2.5 MG tablet  Commonly known as:  COUMADIN  2.5 mg by Feeding Tube route daily. Take along  with 4mg  tab every Sunday, Tuesday, Thursday and Saturday morning to equal 6.5mg  dose.         Signed: Annamarie Major, MD

## 2015-01-08 NOTE — Care Management Important Message (Signed)
Important Message  Patient Details  Name: Brittany Madden MRN: [id number] Date of Birth: Jun 11, 1960   Medicare Important Message Given:  Yes-third notification given    Olegario Messier A Allmond 01/08/2015, 10:15 AM

## 2015-01-08 NOTE — Progress Notes (Signed)
Initial Nutrition Assessment    INTERVENTION:   EN: recommend continuing current TF regimen. Discussed with Dr. Amado Coe and MD agreeable to discontinuing IV fluids at this time Nutrition related Medication: if continues without BM, recommend modifying bowel regimen  NUTRITION DIAGNOSIS:   Inadequate oral intake related to inability to eat, acute illness as evidenced by NPO status, being addressed with TF   GOAL:   Patient will meet greater than or equal to 90% of their needs   MONITOR:    (EN, Digestive System, Electrolyte/Renal Profile, Glucose Profile, Anthropometrics)  REASON FOR ASSESSMENT:   Consult Enteral/tube feeding initiation and management  ASSESSMENT:     EN: Tolerating tube feeding well per RN, Whitney, 4 cans per day with prostat  Digestive system: no signs and symptoms of TF intolerance.  Medications: reviewed  Labs reviewed  Diet Order:  Diet NPO time specified  Skin:   (stage II/III pressure ulcer on buttock)  Last BM:  +medium BM 8/1  Height:   Ht Readings from Last 1 Encounters:  01/02/15 5\' 5"  (1.651 m)    Weight:   Wt Readings from Last 1 Encounters:  01/08/15 106 lb 6.4 oz (48.263 kg)     BMI:  Body mass index is 17.71 kg/(m^2).  Estimated Nutritional Needs:   Kcal:  1435-1694 kcals (BEE 1086, 1.2 AF, 1.1-1.3 IF)  Protein:  61-75 g (1.3-1.6 g/kg)   Fluid:  1410-1645 mL (30-35 ml/kg)   EDUCATION NEEDS:    No education needs at this time  MODERATE Care Level  Damoney Julia B. Freida Busman, RD, LDN [phone] (pager)

## 2015-01-09 LAB — GLUCOSE, CAPILLARY
GLUCOSE-CAPILLARY: 101 mg/dL — AB (ref 65–99)
GLUCOSE-CAPILLARY: 89 mg/dL (ref 65–99)
Glucose-Capillary: 100 mg/dL — ABNORMAL HIGH (ref 65–99)

## 2015-01-09 MED ORDER — OXYCODONE-ACETAMINOPHEN 5-325 MG PO TABS: 1.0000 | ORAL_TABLET | ORAL | Status: DC | PRN

## 2015-01-09 MED ORDER — HEPARIN SODIUM (PORCINE) 5000 UNIT/ML IJ SOLN: 5000.0000 [IU] | Freq: Three times a day (TID) | INTRAMUSCULAR | Status: DC

## 2015-01-09 NOTE — Progress Notes (Signed)
Patient is medically stable for D/C back to [facility] today. Per RN at [facility] patient is going to room 103. RN will call report to [facility] and arrange EMS for transport. Clinical Child psychotherapist (CSW) prepared D/C packet and faxed D/C Summary to [facility]. CSW contacted patient's brother Jorja Loa and made him aware of above. Jorja Loa is in agreement with patient returning to [facility] today. Please reconsult if future social work needs arise. CSW signing off.   Jetta Lout, LCSWA [phone]

## 2015-07-01 ENCOUNTER — Other Ambulatory Visit
Admission: RE | Admit: 2015-07-01 | Discharge: 2015-07-01 | Disposition: A | Discharge status: Home / Self Care | Payer: Medicare Other | Source: Ambulatory Visit | Attending: Nurse Practitioner | Admitting: Nurse Practitioner

## 2015-07-01 DIAGNOSIS — Z029 Encounter for administrative examinations, unspecified: Secondary | ICD-10-CM | POA: Insufficient documentation

## 2015-07-01 LAB — BASIC METABOLIC PANEL
Anion gap: 9 (ref 5–15)
BUN: 10 mg/dL (ref 6–20)
CO2: 25 mmol/L (ref 22–32)
Calcium: 8.9 mg/dL (ref 8.9–10.3)
Chloride: 99 mmol/L — ABNORMAL LOW (ref 101–111)
Creatinine, Ser: <0.3 mg/dL — ABNORMAL LOW (ref 0.44–1.00)
Glucose, Bld: 113 mg/dL — ABNORMAL HIGH (ref 65–99)
POTASSIUM: 4.5 mmol/L (ref 3.5–5.1)
Sodium: 133 mmol/L — ABNORMAL LOW (ref 135–145)

## 2015-07-01 LAB — CBC WITH DIFFERENTIAL/PLATELET
Basophils Absolute: 0.1 10*3/uL (ref 0–0.1)
Basophils Relative: 0 %
EOS PCT: 2 %
Eosinophils Absolute: 0.2 10*3/uL (ref 0–0.7)
HEMATOCRIT: 44.3 % (ref 35.0–47.0)
Hemoglobin: 14.4 g/dL (ref 12.0–16.0)
LYMPHS ABS: 1.3 10*3/uL (ref 1.0–3.6)
Lymphocytes Relative: 10 %
MCH: 26.1 pg (ref 26.0–34.0)
MCHC: 32.4 g/dL (ref 32.0–36.0)
MCV: 80.4 fL (ref 80.0–100.0)
MONO ABS: 1.3 10*3/uL — AB (ref 0.2–0.9)
Monocytes Relative: 10 %
Neutro Abs: 10.5 10*3/uL — ABNORMAL HIGH (ref 1.4–6.5)
Neutrophils Relative %: 78 %
PLATELETS: 390 10*3/uL (ref 150–440)
RBC: 5.51 MIL/uL — ABNORMAL HIGH (ref 3.80–5.20)
RDW: 16.5 % — AB (ref 11.5–14.5)
WBC: 13.4 10*3/uL — ABNORMAL HIGH (ref 3.6–11.0)

## 2015-07-23 ENCOUNTER — Other Ambulatory Visit
Admission: RE | Admit: 2015-07-23 | Discharge: 2015-07-23 | Disposition: A | Discharge status: Home / Self Care | Payer: Medicare Other | Source: Ambulatory Visit | Attending: Family Medicine | Admitting: Family Medicine

## 2015-07-23 DIAGNOSIS — R000 Tachycardia, unspecified: Secondary | ICD-10-CM | POA: Insufficient documentation

## 2015-07-23 DIAGNOSIS — R509 Fever, unspecified: Secondary | ICD-10-CM | POA: Insufficient documentation

## 2015-07-23 LAB — CBC WITH DIFFERENTIAL/PLATELET
Basophils Absolute: 0 10*3/uL (ref 0–0.1)
Basophils Relative: 1 %
Eosinophils Absolute: 0.4 10*3/uL (ref 0–0.7)
Eosinophils Relative: 4 %
HCT: 41.6 % (ref 35.0–47.0)
HEMOGLOBIN: 13.7 g/dL (ref 12.0–16.0)
LYMPHS ABS: 1.9 10*3/uL (ref 1.0–3.6)
LYMPHS PCT: 19 %
MCH: 26.4 pg (ref 26.0–34.0)
MCHC: 32.9 g/dL (ref 32.0–36.0)
MCV: 80.1 fL (ref 80.0–100.0)
Monocytes Absolute: 1 10*3/uL — ABNORMAL HIGH (ref 0.2–0.9)
Monocytes Relative: 10 %
NEUTROS PCT: 66 %
Neutro Abs: 6.5 10*3/uL (ref 1.4–6.5)
Platelets: 349 10*3/uL (ref 150–440)
RBC: 5.19 MIL/uL (ref 3.80–5.20)
RDW: 16.7 % — ABNORMAL HIGH (ref 11.5–14.5)
WBC: 9.9 10*3/uL (ref 3.6–11.0)

## 2015-07-23 LAB — BASIC METABOLIC PANEL
Anion gap: 7 (ref 5–15)
BUN: 10 mg/dL (ref 6–20)
CHLORIDE: 103 mmol/L (ref 101–111)
CO2: 28 mmol/L (ref 22–32)
Calcium: 9 mg/dL (ref 8.9–10.3)
Creatinine, Ser: <0.3 mg/dL — ABNORMAL LOW (ref 0.44–1.00)
GLUCOSE: 80 mg/dL (ref 65–99)
POTASSIUM: 4.2 mmol/L (ref 3.5–5.1)
Sodium: 138 mmol/L (ref 135–145)

## 2015-07-23 LAB — URINALYSIS COMPLETE WITH MICROSCOPIC (ARMC ONLY)
Bilirubin Urine: NEGATIVE
GLUCOSE, UA: NEGATIVE mg/dL
Ketones, ur: NEGATIVE mg/dL
Nitrite: POSITIVE — AB
Protein, ur: NEGATIVE mg/dL
Specific Gravity, Urine: 1.004 — ABNORMAL LOW (ref 1.005–1.030)
pH: 7 (ref 5.0–8.0)

## 2015-07-26 LAB — URINE CULTURE: Culture: >=100000

## 2015-11-27 ENCOUNTER — Inpatient Hospital Stay
Admission: EM | Admit: 2015-11-27 | Discharge: 2015-12-02 | DRG: 871 | Disposition: A | Discharge status: Skilled Nursing Facility | Payer: Medicare Other | Attending: Internal Medicine | Admitting: Internal Medicine

## 2015-11-27 ENCOUNTER — Emergency Department: Payer: Medicare Other

## 2015-11-27 ENCOUNTER — Other Ambulatory Visit: Payer: Self-pay

## 2015-11-27 DIAGNOSIS — Z86711 Personal history of pulmonary embolism: Secondary | ICD-10-CM

## 2015-11-27 DIAGNOSIS — G8389 Other specified paralytic syndromes: Secondary | ICD-10-CM | POA: Diagnosis present

## 2015-11-27 DIAGNOSIS — Z681 Body mass index (BMI) 19 or less, adult: Secondary | ICD-10-CM | POA: Diagnosis not present

## 2015-11-27 DIAGNOSIS — R6520 Severe sepsis without septic shock: Secondary | ICD-10-CM | POA: Diagnosis present

## 2015-11-27 DIAGNOSIS — I10 Essential (primary) hypertension: Secondary | ICD-10-CM | POA: Diagnosis present

## 2015-11-27 DIAGNOSIS — I959 Hypotension, unspecified: Secondary | ICD-10-CM | POA: Diagnosis present

## 2015-11-27 DIAGNOSIS — R509 Fever, unspecified: Secondary | ICD-10-CM

## 2015-11-27 DIAGNOSIS — M869 Osteomyelitis, unspecified: Secondary | ICD-10-CM | POA: Diagnosis present

## 2015-11-27 DIAGNOSIS — G35 Multiple sclerosis: Secondary | ICD-10-CM | POA: Diagnosis present

## 2015-11-27 DIAGNOSIS — D500 Iron deficiency anemia secondary to blood loss (chronic): Secondary | ICD-10-CM | POA: Diagnosis present

## 2015-11-27 DIAGNOSIS — A4102 Sepsis due to Methicillin resistant Staphylococcus aureus: Secondary | ICD-10-CM | POA: Diagnosis present

## 2015-11-27 DIAGNOSIS — R532 Functional quadriplegia: Secondary | ICD-10-CM | POA: Diagnosis present

## 2015-11-27 DIAGNOSIS — L89153 Pressure ulcer of sacral region, stage 3: Secondary | ICD-10-CM | POA: Diagnosis not present

## 2015-11-27 DIAGNOSIS — A419 Sepsis, unspecified organism: Secondary | ICD-10-CM

## 2015-11-27 DIAGNOSIS — M009 Pyogenic arthritis, unspecified: Secondary | ICD-10-CM | POA: Diagnosis present

## 2015-11-27 DIAGNOSIS — Z452 Encounter for adjustment and management of vascular access device: Secondary | ICD-10-CM

## 2015-11-27 DIAGNOSIS — L089 Local infection of the skin and subcutaneous tissue, unspecified: Secondary | ICD-10-CM

## 2015-11-27 DIAGNOSIS — R000 Tachycardia, unspecified: Secondary | ICD-10-CM | POA: Diagnosis present

## 2015-11-27 DIAGNOSIS — M25551 Pain in right hip: Secondary | ICD-10-CM

## 2015-11-27 DIAGNOSIS — E162 Hypoglycemia, unspecified: Secondary | ICD-10-CM | POA: Diagnosis present

## 2015-11-27 DIAGNOSIS — E871 Hypo-osmolality and hyponatremia: Secondary | ICD-10-CM | POA: Diagnosis present

## 2015-11-27 DIAGNOSIS — D62 Acute posthemorrhagic anemia: Secondary | ICD-10-CM | POA: Diagnosis present

## 2015-11-27 DIAGNOSIS — F329 Major depressive disorder, single episode, unspecified: Secondary | ICD-10-CM | POA: Diagnosis present

## 2015-11-27 DIAGNOSIS — L89314 Pressure ulcer of right buttock, stage 4: Secondary | ICD-10-CM | POA: Diagnosis not present

## 2015-11-27 DIAGNOSIS — Z931 Gastrostomy status: Secondary | ICD-10-CM | POA: Diagnosis not present

## 2015-11-27 DIAGNOSIS — Z87891 Personal history of nicotine dependence: Secondary | ICD-10-CM

## 2015-11-27 DIAGNOSIS — T148XXA Other injury of unspecified body region, initial encounter: Secondary | ICD-10-CM

## 2015-11-27 DIAGNOSIS — L89310 Pressure ulcer of right buttock, unstageable: Secondary | ICD-10-CM | POA: Diagnosis present

## 2015-11-27 DIAGNOSIS — L89154 Pressure ulcer of sacral region, stage 4: Secondary | ICD-10-CM | POA: Diagnosis present

## 2015-11-27 DIAGNOSIS — G8929 Other chronic pain: Secondary | ICD-10-CM

## 2015-11-27 DIAGNOSIS — N319 Neuromuscular dysfunction of bladder, unspecified: Secondary | ICD-10-CM | POA: Diagnosis present

## 2015-11-27 DIAGNOSIS — E43 Unspecified severe protein-calorie malnutrition: Secondary | ICD-10-CM | POA: Diagnosis present

## 2015-11-27 DIAGNOSIS — L89309 Pressure ulcer of unspecified buttock, unspecified stage: Secondary | ICD-10-CM | POA: Insufficient documentation

## 2015-11-27 DIAGNOSIS — Z86718 Personal history of other venous thrombosis and embolism: Secondary | ICD-10-CM | POA: Diagnosis not present

## 2015-11-27 DIAGNOSIS — T148 Other injury of unspecified body region: Secondary | ICD-10-CM | POA: Diagnosis present

## 2015-11-27 DIAGNOSIS — N390 Urinary tract infection, site not specified: Secondary | ICD-10-CM | POA: Diagnosis not present

## 2015-11-27 DIAGNOSIS — N39498 Other specified urinary incontinence: Secondary | ICD-10-CM | POA: Diagnosis present

## 2015-11-27 DIAGNOSIS — Z66 Do not resuscitate: Secondary | ICD-10-CM | POA: Diagnosis present

## 2015-11-27 LAB — GLUCOSE, CAPILLARY
GLUCOSE-CAPILLARY: 80 mg/dL (ref 65–99)
Glucose-Capillary: 69 mg/dL (ref 65–99)
Glucose-Capillary: 115 mg/dL — ABNORMAL HIGH (ref 65–99)
Glucose-Capillary: 92 mg/dL (ref 65–99)

## 2015-11-27 LAB — COMPREHENSIVE METABOLIC PANEL
ALBUMIN: 2.2 g/dL — AB (ref 3.5–5.0)
ALK PHOS: 179 U/L — AB (ref 38–126)
ALT: 78 U/L — ABNORMAL HIGH (ref 14–54)
ALT: 49 U/L (ref 14–54)
AST: 63 U/L — AB (ref 15–41)
AST: 40 U/L (ref 15–41)
Albumin: 1.8 g/dL — ABNORMAL LOW (ref 3.5–5.0)
Alkaline Phosphatase: 136 U/L — ABNORMAL HIGH (ref 38–126)
Anion gap: 9 (ref 5–15)
Anion gap: 5 (ref 5–15)
BILIRUBIN TOTAL: 0.8 mg/dL (ref 0.3–1.2)
BUN: 9 mg/dL (ref 6–20)
BUN: 5 mg/dL — ABNORMAL LOW (ref 6–20)
CALCIUM: 8.6 mg/dL — AB (ref 8.9–10.3)
CHLORIDE: 110 mmol/L (ref 101–111)
CO2: 24 mmol/L (ref 22–32)
CO2: 22 mmol/L (ref 22–32)
Calcium: 7.9 mg/dL — ABNORMAL LOW (ref 8.9–10.3)
Chloride: 99 mmol/L — ABNORMAL LOW (ref 101–111)
Creatinine, Ser: 0.32 mg/dL — ABNORMAL LOW (ref 0.44–1.00)
GFR calc Af Amer: >60 mL/min (ref >60)
GLUCOSE: 106 mg/dL — AB (ref 65–99)
Glucose, Bld: 79 mg/dL (ref 65–99)
Potassium: 3.9 mmol/L (ref 3.5–5.1)
Potassium: 4.6 mmol/L (ref 3.5–5.1)
SODIUM: 137 mmol/L (ref 135–145)
Sodium: 132 mmol/L — ABNORMAL LOW (ref 135–145)
TOTAL PROTEIN: 8.1 g/dL (ref 6.5–8.1)
Total Bilirubin: 1.1 mg/dL (ref 0.3–1.2)
Total Protein: 6.5 g/dL (ref 6.5–8.1)

## 2015-11-27 LAB — URINALYSIS COMPLETE WITH MICROSCOPIC (ARMC ONLY)
BILIRUBIN URINE: NEGATIVE
Bacteria, UA: NONE SEEN
GLUCOSE, UA: NEGATIVE mg/dL
HGB URINE DIPSTICK: NEGATIVE
Ketones, ur: NEGATIVE mg/dL
NITRITE: NEGATIVE
PROTEIN: NEGATIVE mg/dL
SPECIFIC GRAVITY, URINE: 1.014 (ref 1.005–1.030)
pH: 5 (ref 5.0–8.0)

## 2015-11-27 LAB — LACTIC ACID, PLASMA: LACTIC ACID, VENOUS: 0.9 mmol/L (ref 0.5–2.0)

## 2015-11-27 LAB — CBC WITH DIFFERENTIAL/PLATELET
BASOS ABS: 0 10*3/uL (ref 0–0.1)
BASOS PCT: 0 %
EOS ABS: 0 10*3/uL (ref 0–0.7)
Eosinophils Relative: 0 %
HCT: 35.3 % (ref 35.0–47.0)
HEMOGLOBIN: 11.2 g/dL — AB (ref 12.0–16.0)
LYMPHS ABS: 1.6 10*3/uL (ref 1.0–3.6)
Lymphocytes Relative: 7 %
MCH: 26.1 pg (ref 26.0–34.0)
MCHC: 31.9 g/dL — AB (ref 32.0–36.0)
MCV: 81.8 fL (ref 80.0–100.0)
MONO ABS: 2.3 10*3/uL — AB (ref 0.2–0.9)
Monocytes Relative: 10 %
NEUTROS ABS: 18.9 10*3/uL — AB (ref 1.4–6.5)
Neutrophils Relative %: 83 %
PLATELETS: 505 10*3/uL — AB (ref 150–440)
RBC: 4.31 MIL/uL (ref 3.80–5.20)
RDW: 15.3 % — AB (ref 11.5–14.5)
WBC: 22.8 10*3/uL — ABNORMAL HIGH (ref 3.6–11.0)

## 2015-11-27 LAB — PROTIME-INR
INR: 1.33
PROTHROMBIN TIME: 16.6 s — AB (ref 11.4–15.0)

## 2015-11-27 LAB — HEMOGLOBIN: Hemoglobin: 9.8 g/dL — ABNORMAL LOW (ref 12.0–16.0)

## 2015-11-27 LAB — APTT: APTT: 39 s — AB (ref 24–36)

## 2015-11-27 LAB — MRSA PCR SCREENING: MRSA BY PCR: NEGATIVE

## 2015-11-27 LAB — TROPONIN I: Troponin I: <0.03 ng/mL (ref <0.031)

## 2015-11-27 LAB — LIPASE, BLOOD: LIPASE: 13 U/L (ref 11–51)

## 2015-11-27 MED ORDER — ACETAMINOPHEN 650 MG RE SUPP: 650.0000 mg | Freq: Four times a day (QID) | RECTAL | Status: DC | PRN

## 2015-11-27 MED ORDER — ONDANSETRON HCL 4 MG/2ML IJ SOLN
4.0000 mg | Freq: Once | INTRAMUSCULAR | Status: AC
  Administered 2015-11-27: 4 mg via INTRAVENOUS

## 2015-11-27 MED ORDER — CHLORHEXIDINE GLUCONATE 0.12 % MT SOLN
15.0000 mL | Freq: Two times a day (BID) | OROMUCOSAL | Status: DC
  Administered 2015-11-27 – 2015-12-02 (×10): 15 mL via OROMUCOSAL
  Filled 2015-11-27 (×7): qty 15

## 2015-11-27 MED ORDER — COLLAGENASE 250 UNIT/GM EX OINT
TOPICAL_OINTMENT | Freq: Every day | CUTANEOUS | Status: DC
  Administered 2015-11-27 – 2015-11-28 (×2): via TOPICAL
  Administered 2015-11-29: 1 via TOPICAL
  Administered 2015-11-30 – 2015-12-02 (×3): via TOPICAL
  Filled 2015-11-27: qty 30

## 2015-11-27 MED ORDER — FAMOTIDINE IN NACL 20-0.9 MG/50ML-% IV SOLN: 20.0000 mg | Freq: Two times a day (BID) | INTRAVENOUS | Status: DC

## 2015-11-27 MED ORDER — LEVOFLOXACIN IN D5W 750 MG/150ML IV SOLN
750.0000 mg | INTRAVENOUS | Status: DC
  Administered 2015-11-28 – 2015-12-02 (×5): 750 mg via INTRAVENOUS
  Filled 2015-11-27 (×6): qty 150

## 2015-11-27 MED ORDER — METOPROLOL TARTRATE 25 MG PO TABS
12.5000 mg | ORAL_TABLET | Freq: Two times a day (BID) | ORAL | Status: DC
  Administered 2015-11-28 – 2015-11-29 (×2): 12.5 mg via ORAL
  Filled 2015-11-27 (×3): qty 1

## 2015-11-27 MED ORDER — SODIUM CHLORIDE 0.9 % IV BOLUS (SEPSIS)
1000.0000 mL | Freq: Once | INTRAVENOUS | Status: AC
  Administered 2015-11-27: 1000 mL via INTRAVENOUS

## 2015-11-27 MED ORDER — VANCOMYCIN HCL 500 MG IV SOLR
500.0000 mg | Freq: Two times a day (BID) | INTRAVENOUS | Status: DC
Start: 2015-11-27 — End: 2015-11-29
  Administered 2015-11-27 – 2015-11-29 (×4): 500 mg via INTRAVENOUS
  Filled 2015-11-27 (×5): qty 500

## 2015-11-27 MED ORDER — DEXTROSE 5 % IV SOLN
2.0000 g | Freq: Once | INTRAVENOUS | Status: AC
  Administered 2015-11-27: 2 g via INTRAVENOUS
  Filled 2015-11-27: qty 2

## 2015-11-27 MED ORDER — SODIUM CHLORIDE 0.9% FLUSH
3.0000 mL | Freq: Two times a day (BID) | INTRAVENOUS | Status: DC
  Administered 2015-11-27 – 2015-12-01 (×8): 3 mL via INTRAVENOUS

## 2015-11-27 MED ORDER — SODIUM CHLORIDE 0.9 % IV BOLUS (SEPSIS)
500.0000 mL | Freq: Once | INTRAVENOUS | Status: AC
  Administered 2015-11-27: 500 mL via INTRAVENOUS

## 2015-11-27 MED ORDER — KCL IN DEXTROSE-NACL 20-5-0.9 MEQ/L-%-% IV SOLN
INTRAVENOUS | Status: DC
  Administered 2015-11-27 – 2015-11-28 (×2): via INTRAVENOUS
  Filled 2015-11-27 (×3): qty 1000

## 2015-11-27 MED ORDER — LEVOFLOXACIN IN D5W 750 MG/150ML IV SOLN
750.0000 mg | Freq: Once | INTRAVENOUS | Status: AC
  Administered 2015-11-27: 750 mg via INTRAVENOUS
  Filled 2015-11-27: qty 150

## 2015-11-27 MED ORDER — JEVITY 1.5 CAL/FIBER PO LIQD
1000.0000 mL | ORAL | Status: DC
  Administered 2015-11-27 – 2015-11-29 (×4): 1000 mL

## 2015-11-27 MED ORDER — CETYLPYRIDINIUM CHLORIDE 0.05 % MT LIQD
7.0000 mL | Freq: Two times a day (BID) | OROMUCOSAL | Status: DC
  Administered 2015-11-27 – 2015-12-01 (×9): 7 mL via OROMUCOSAL

## 2015-11-27 MED ORDER — POTASSIUM CHLORIDE IN NACL 20-0.9 MEQ/L-% IV SOLN
INTRAVENOUS | Status: DC
  Filled 2015-11-27: qty 1000

## 2015-11-27 MED ORDER — SODIUM CHLORIDE 0.9 % IV SOLN
INTRAVENOUS | Status: DC
  Administered 2015-11-27: 08:00:00 via INTRAVENOUS

## 2015-11-27 MED ORDER — CARBOXYMETHYLCELLULOSE SODIUM 0.5 % OP SOLN: 1.0000 [drp] | Freq: Two times a day (BID) | OPHTHALMIC | Status: DC

## 2015-11-27 MED ORDER — ERGOCALCIFEROL 8000 UNIT/ML PO SOLN: 50000.0000 [IU] | ORAL | Status: DC

## 2015-11-27 MED ORDER — ACETAMINOPHEN 325 MG PO TABS
650.0000 mg | ORAL_TABLET | Freq: Four times a day (QID) | ORAL | Status: DC | PRN
  Administered 2015-11-28 – 2015-11-30 (×2): 650 mg via ORAL
  Filled 2015-11-27 (×2): qty 2

## 2015-11-27 MED ORDER — BIOTENE DRY MOUTH MT LIQD: 15.0000 mL | OROMUCOSAL | Status: DC | PRN

## 2015-11-27 MED ORDER — GABAPENTIN 250 MG/5ML PO SOLN
600.0000 mg | Freq: Three times a day (TID) | ORAL | Status: DC
  Administered 2015-11-27 – 2015-12-02 (×14): 600 mg
  Filled 2015-11-27 (×19): qty 12

## 2015-11-27 MED ORDER — DEXTROSE 5 % IV SOLN
2.0000 g | Freq: Three times a day (TID) | INTRAVENOUS | Status: DC
  Administered 2015-11-27 – 2015-11-29 (×7): 2 g via INTRAVENOUS
  Filled 2015-11-27 (×8): qty 2

## 2015-11-27 MED ORDER — ESCITALOPRAM OXALATE 10 MG PO TABS
10.0000 mg | ORAL_TABLET | Freq: Every day | ORAL | Status: DC
  Administered 2015-11-28: 10 mg
  Filled 2015-11-27: qty 1

## 2015-11-27 MED ORDER — POLYVINYL ALCOHOL 1.4 % OP SOLN
1.0000 [drp] | Freq: Two times a day (BID) | OPHTHALMIC | Status: DC
  Administered 2015-11-27 – 2015-12-02 (×10): 1 [drp] via OPHTHALMIC
  Filled 2015-11-27: qty 15

## 2015-11-27 MED ORDER — NOREPINEPHRINE 4 MG/250ML-% IV SOLN: 0.0000 ug/min | INTRAVENOUS | Status: DC

## 2015-11-27 MED ORDER — FAMOTIDINE IN NACL 20-0.9 MG/50ML-% IV SOLN
20.0000 mg | Freq: Two times a day (BID) | INTRAVENOUS | Status: DC
  Administered 2015-11-27 – 2015-11-29 (×6): 20 mg via INTRAVENOUS
  Filled 2015-11-27 (×8): qty 50

## 2015-11-27 MED ORDER — NOREPINEPHRINE BITARTRATE 1 MG/ML IV SOLN: 0.0000 ug/min | INTRAVENOUS | Status: DC

## 2015-11-27 MED ORDER — TWOCAL HN PO LIQD: 237.0000 mL | ORAL | Status: DC

## 2015-11-27 MED ORDER — ONDANSETRON HCL 4 MG/2ML IJ SOLN
INTRAMUSCULAR | Status: AC
  Filled 2015-11-27: qty 2

## 2015-11-27 MED ORDER — VANCOMYCIN HCL IN DEXTROSE 1-5 GM/200ML-% IV SOLN
1000.0000 mg | Freq: Once | INTRAVENOUS | Status: AC
  Administered 2015-11-27: 1000 mg via INTRAVENOUS
  Filled 2015-11-27: qty 200

## 2015-11-27 MED ORDER — JEVITY 1.2 CAL PO LIQD: 1000.0000 mL | ORAL | Status: DC

## 2015-11-27 MED ORDER — ESCITALOPRAM OXALATE 5 MG/5ML PO SOLN: 10.0000 mg | Freq: Every day | ORAL | Status: DC

## 2015-11-27 NOTE — Consult Note (Signed)
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  Bilateral heels are intact. Sacrum is intact. Wound type:Pressure Pressure Ulcer POA: Yes Measurement: Right buttocks, lateral wound:  Unstageable.  $cm x 3cm with depth undetermined due to the presence of loose hanging eschar.  Tunneling measuring 3cm is located at the 4 o'clock position.  Right buttock medial wound, Stage 4.  3cm x 2.5cm x 2cm with tunneling measuring 3cm at 8 o'clock. Wound bed: Lateral wound is completely obscured by the presence of necrotic eschar, loose and hanging.  Conservative sharp debridement is performed to remove a piece of loose hanging tissue with no bleeding and no indication of patient discomfort. The medial wound is pal pink and non-granulating, no necrotic tissue evident. Drainage (amount, consistency, odor) Moderate amount of light brown, malodorous drainage from the lateral wound,  consistent with the autolytic debridement of necrotic tissue (eschar)  and fat. Continuing drainage as gentle pressure is exerted in the periwound area from this wound. Scant serous drainage from the medial wound. Periwound: Intact, with deep red/purple discoloration measuring 1cm x 2.5cm in the 4 o'clock position from the lateral wound.  Tunneling extends from lateral wound to this area beneath skin level. Dressing procedure/placement/frequency: I suggest surgical consult for consideration of surgical debridement of the lateral ulcer and for assessment and oversight of the possible deeper destruction in this region.  If you agree, please order/arrange.  In the meantime, I have implemented twice daily wound care using an enzymatic debriding agent (collagenase/Santyl) topped with a damp saline dressing and covered with an absorbant dressing and secured with tape.  An individualized turning schedule is suggested by her bedside RN,  Cala Bradford and I agree.  We will put forth a plan to turn to left side for 2 hours, then right side for only 1 hour.  Bilateral pressure redistribution boots are placed for pressure injury prevention and a prophyllactice sacral dressing is place for the same reason.  Both heels and the sacrum are intact. WOC nursing team will not follow, but will remain available to this patient, the nursing and medical teams.  Please re-consult if needed. Thanks, Ladona Mow, MSN, RN, GNP, [place]  Pager# [phone]

## 2015-11-27 NOTE — ED Notes (Signed)
ED secretary asked by charge RN to activate CODE SEPSIS at this time. Called placed to appropriate personnel by Diplomatic Services operational officer.

## 2015-11-27 NOTE — ED Notes (Signed)
Per EMS: EMS called out for bleeding decub ulcer

## 2015-11-27 NOTE — H&P (Signed)
PCP:   Lorrin Mais, MD   Chief Complaint:  Bleeding from sacral decubitus  HPI: This is a 55 year old female with history of multiple sclerosis. She has several sacral decubitus. Today she was sent in from nursing home because she was bleeding significantly from a sacral decubitus. By time she got here the bleeding had constant. She was tachycardic with heart rates into the 160s and febrile. She had a elevated white blood count to her lactic acid was normal. At one point she had a low blood pressure to 1/56. She received some fluids. She's been sent on a sepsis protocol. The ER physician spoke with the patient's POA who confirmed the patient is DO NOT RESUSCITATE but requires antibiotic treatment.  Review of Systems:  Unable to obtain due to patient's nonverbal status.  Past Medical History: Past Medical History  Diagnosis Date  . MS (multiple sclerosis) (HCC)   . Depression   . Functional quadriplegia (HCC)   . Neurogenic bladder   . PE (pulmonary embolism) 2012  . DVT (deep venous thrombosis) (HCC) 2012   Past Surgical History  Procedure Laterality Date  . Tonsillectomy    . Laparoscopic tubal ligation    . Examination under anesthesia Right 01/03/2015    Procedure: EXAM UNDER ANESTHESIA, repair of vaginal laceration ;  Surgeon: Elenora Fender Ward, MD;  Location: [facility] ORS;  Service: Gynecology;  Laterality: Right;    Medications: Prior to Admission medications   Medication Sig Start Date End Date Taking? Authorizing Provider  antiseptic oral rinse (BIOTENE) LIQD 15 mLs by Mouth Rinse route every 2 (two) hours as needed for dry mouth.   Yes Historical Provider, MD  baclofen (LIORESAL) 20 MG tablet 20 mg by Feeding Tube route every 6 (six) hours.   Yes Historical Provider, MD  carboxymethylcellulose (REFRESH TEARS) 0.5 % SOLN Place 1 drop into both eyes 2 (two) times daily.   Yes Historical Provider, MD  clopidogrel (PLAVIX) 75 MG tablet Take 75 mg by mouth daily.   Yes  Historical Provider, MD  coal tar (NEUTROGENA T-GEL) 0.5 % shampoo Apply 1 application topically 2 (two) times a week.   Yes Historical Provider, MD  Cranberry-Vitamin C-Inulin (UTI-STAT PO) Take 30 mLs by mouth daily.   Yes Historical Provider, MD  ergocalciferol (DRISDOL) 8000 UNIT/ML drops Take 50,000 Units by mouth every 30 (thirty) days.   Yes Historical Provider, MD  escitalopram (LEXAPRO) 5 MG/5ML solution 10 mg by Feeding Tube route daily.   Yes Historical Provider, MD  gabapentin (NEURONTIN) 250 MG/5ML solution Place 600 mg into feeding tube 3 (three) times daily.   Yes Historical Provider, MD  metoprolol tartrate (LOPRESSOR) 25 MG tablet Take 12.5 mg by mouth 2 (two) times daily.   Yes Historical Provider, MD  mineral oil-hydrophilic petrolatum (AQUAPHOR) ointment Apply 1 application topically daily. To feet and legs.   Yes Historical Provider, MD  Nutritional Supplements (TWOCAL HN) LIQD Take 237 mLs by mouth daily.   Yes Historical Provider, MD  potassium chloride SA (K-DUR,KLOR-CON) 20 MEQ tablet 20 mEq by Feeding Tube route daily. Dissolve in water then give via g-tube.   Yes Historical Provider, MD  ranitidine (ZANTAC) 75 MG tablet Take 75 mg by mouth at bedtime.   Yes Historical Provider, MD    Allergies:   Allergies  Allergen Reactions  . Asa [Aspirin] Other (See Comments)    Reaction: unknown  . Keflex [Cephalexin] Other (See Comments)    Reaction: unknown   . Nsaids Other (See Comments)  Reaction: unknown   . Other Other (See Comments)    SEAFOOD  . Salicylates Other (See Comments)    Reaction: unknown    Social History:  reports that she has quit smoking. She has never used smokeless tobacco. She reports that she does not drink alcohol or use illicit drugs.  Family History: Unable to obtain due to patient's nonverbal status  Physical Exam: Filed Vitals:   11/27/15 6568 11/27/15 1275 11/27/15 0645 11/27/15 0658  BP:  112/81 96/63 99/61   Pulse: 136 136 129  126  Temp:      TempSrc:      Resp: 25 24 23 20   Height:      Weight:      SpO2: 100% 100% 100% 100%    General:  Cachectic, nonverbal patient, malnourished, no acute distress Eyes: PERRLA, pink conjunctiva, no scleral icterus ENT: Moist oral mucosa, neck supple, no thyromegaly Lungs: clear to ascultation, no wheeze, no crackles, no use of accessory muscles Cardiovascular: regular rate and rhythm, no regurgitation, no gallops, no murmurs. No carotid bruits, no JVD Abdomen: soft, positive BS, mild tenderness right, distended, NGT in place, no organomegaly, not an acute abdomen GU: not examined Neuro: Unable to assess due to patient's mentation and no temporal status Musculoskeletal: Contracted, Skin: Several sacral decubitus. Stages ranging from 1-4 with tracking Psych: Nonverbal patient appropriate patient   Labs on Admission:   Recent Labs  11/27/15 0500  NA 132*  K 3.9  CL 99*  CO2 24  GLUCOSE 106*  BUN 9  CREATININE 0.32*  CALCIUM 8.6*    Recent Labs  11/27/15 0500  AST 63*  ALT 78*  ALKPHOS 179*  BILITOT 0.8  PROT 8.1  ALBUMIN 2.2*    Recent Labs  11/27/15 0500  LIPASE 13    Recent Labs  11/27/15 0500  WBC 22.8*  NEUTROABS 18.9*  HGB 11.2*  HCT 35.3  MCV 81.8  PLT 505*    Recent Labs  11/27/15 0500  TROPONINI <0.03   Invalid input(s): POCBNP No results for input(s): DDIMER in the last 72 hours. No results for input(s): HGBA1C in the last 72 hours. No results for input(s): CHOL, HDL, LDLCALC, TRIG, CHOLHDL, LDLDIRECT in the last 72 hours. No results for input(s): TSH, T4TOTAL, T3FREE, THYROIDAB in the last 72 hours.  Invalid input(s): FREET3 No results for input(s): VITAMINB12, FOLATE, FERRITIN, TIBC, IRON, RETICCTPCT in the last 72 hours.  Micro Results: No results found for this or any previous visit (from the past 240 hour(s)).   Radiological Exams on Admission: Dg Chest Port 1 View  11/27/2015  CLINICAL DATA:  55 year old  female with sepsis EXAM: PORTABLE CHEST 1 VIEW COMPARISON:  Chest CT dated 09/28/2012 FINDINGS: Single portable view of the chest demonstrate minimal left lung base atelectatic changes. There is no focal consolidation, pleural effusion, or pneumothorax. The cardiac silhouette is within normal limits. No acute osseous pathology. IMPRESSION: No active disease. Electronically Signed   By: Elgie Collard M.D.   On: 11/27/2015 05:57    Assessment/Plan Present on Admission:  . Sepsis (HCC) -Admit to MedSurg with telemetry -Blood cultures pending -IV antibiotics ordered pharmacy to dose  . Decubitus ulcer of sacral region, stage 3 (HCC) -Wound care consulted  . multiple sclerosis/ Neurogenic bladder -End-stage. -Resumed patient's home medications    Brittany Madden 11/27/2015, 7:04 AM

## 2015-11-27 NOTE — ED Provider Notes (Signed)
[facility] Emergency Department Provider Note  ____________________________________________  Time seen: Approximately 5:14 AM  I have reviewed the triage vital signs and the nursing notes.   HISTORY  Chief Complaint Wound Infection  The patient is nonverbal at baseline  HPI Brittany Madden is a 55 y.o. female with severe chronic medical issues including multiple sclerosis and functional quadriplegia and nonverbal status who is at a skilled nursing facility and has a DO NOT RESUSCITATE order in place.  She presents by EMS for evaluation of what was reported as a bleeding decubitus ulcer.  She has a deep chronic decubitus ulcer on the right buttock which reportedly was bleeding heavily earlier this evening.  It has apparently clotted off since arriving in the emergency department and it is currently not bleeding, but she appears septic with a heart rate of 146 and a rectal temperature of 100.6.  She will history is available.  Her presentation, however, is consistent with sepsis and we are proceeding with code sepsis protocol.Her severity is critical.   Past Medical History  Diagnosis Date  . MS (multiple sclerosis) (HCC)   . Depression   . Functional quadriplegia (HCC)   . Neurogenic bladder   . PE (pulmonary embolism) 2012  . DVT (deep venous thrombosis) (HCC) 2012    Patient Active Problem List   Diagnosis Date Noted  . Sepsis (HCC) 11/27/2015  . Decubitus ulcer of sacral region, stage 3 (HCC) 11/27/2015  . 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 Neurogenic bladder 11/27/2015  . Pressure ulcer 01/04/2015  . Anemia 01/04/2015  . Vaginal bleeding 01/03/2015  . Chronic anticoagulation 01/03/2015  . History of DVT (deep vein thrombosis) 01/03/2015  . History of pulmonary embolus (PE) 01/03/2015    Past Surgical History  Procedure Laterality Date  . Tonsillectomy    . Laparoscopic tubal ligation    . Examination under  anesthesia Right 01/03/2015    Procedure: EXAM UNDER ANESTHESIA, repair of vaginal laceration ;  Surgeon: Elenora Fender Ward, MD;  Location: [facility] ORS;  Service: Gynecology;  Laterality: Right;    Current Outpatient Rx  Name  Route  Sig  Dispense  Refill  . antiseptic oral rinse (BIOTENE) LIQD   Mouth Rinse   15 mLs by Mouth Rinse route every 2 (two) hours as needed for dry mouth.         . baclofen (LIORESAL) 20 MG tablet   Feeding Tube   20 mg by Feeding Tube route every 6 (six) hours.         . carboxymethylcellulose (REFRESH TEARS) 0.5 % SOLN   Both Eyes   Place 1 drop into both eyes 2 (two) times daily.         . clopidogrel (PLAVIX) 75 MG tablet   Oral   Take 75 mg by mouth daily.         . coal tar (NEUTROGENA T-GEL) 0.5 % shampoo   Topical   Apply 1 application topically 2 (two) times a week.         . Cranberry-Vitamin C-Inulin (UTI-STAT PO)   Oral   Take 30 mLs by mouth daily.         . ergocalciferol (DRISDOL) 8000 UNIT/ML drops   Oral   Take 50,000 Units by mouth every 30 (thirty) days.         Marland Kitchen escitalopram (LEXAPRO) 5 MG/5ML solution   Feeding Tube   10 mg by Feeding Tube route daily.         Marland Kitchen  gabapentin (NEURONTIN) 250 MG/5ML solution   Per Tube   Place 600 mg into feeding tube 3 (three) times daily.         . metoprolol tartrate (LOPRESSOR) 25 MG tablet   Oral   Take 12.5 mg by mouth 2 (two) times daily.         . mineral oil-hydrophilic petrolatum (AQUAPHOR) ointment   Topical   Apply 1 application topically daily. To feet and legs.         . Nutritional Supplements (TWOCAL HN) LIQD   Oral   Take 237 mLs by mouth daily.         . potassium chloride SA (K-DUR,KLOR-CON) 20 MEQ tablet   Feeding Tube   20 mEq by Feeding Tube route daily. Dissolve in water then give via g-tube.         . ranitidine (ZANTAC) 75 MG tablet   Oral   Take 75 mg by mouth at bedtime.           Allergies Asa; Keflex; Nsaids; Other; and  Salicylates  No family history on file.  Social History Social History  Substance Use Topics  . Smoking status: Former Games developer  . Smokeless tobacco: Never Used     Comment:     . Alcohol Use: No    Review of Systems  Critical care caveat - the patient is unable to provide any additional history due to nonverbal status and sepsis ____________________________________________   PHYSICAL EXAM:  VITAL SIGNS: ED Triage Vitals  Enc Vitals Group     BP 11/27/15 0501 144/115 mmHg     Pulse Rate 11/27/15 0501 146     Resp 11/27/15 0501 26     Temp 11/27/15 0501 97.9 F (36.6 C)     Temp Source 11/27/15 0501 Oral     SpO2 11/27/15 0458 96 %     Weight 11/27/15 0501 109 lb 14.4 oz (49.85 kg)     Height 11/27/15 0501 5\' 4"  (1.626 m)     Head Cir --      Peak Flow --      Pain Score --      Pain Loc --      Pain Edu? --      Excl. in GC? --     Constitutional: Awake but unable to communicate.  Chronic ill appearance but also pale and tachycardic Eyes: Conjunctivae are normal. PERRL. EOMI. Head: Atraumatic. Nose: No congestion/rhinnorhea. Mouth/Throat: Mucous membranes are moist.  Oropharynx non-erythematous. Neck: No stridor.    Cardiovascular: Tachycardia, regular rhythm. Good peripheral circulation. Grossly normal heart sounds.   Respiratory: Normal respiratory effort.  No retractions. Lungs CTAB. Gastrointestinal: Soft and nontender. No distention.  Genitourinary: No vaginal bleeding Musculoskeletal: No lower extremity tenderness nor edema. No gross deformities of extremities. Neurologic:  Normal speech and language. No gross focal neurologic deficits are appreciated.  Skin:  Multiple chronic decubitus ulcers ranging from stage 2 to stage 4.  The bleeding wound is filled with clot and thus unstageable.  ____________________________________________   LABS (all labs ordered are listed, but only abnormal results are displayed)  Labs Reviewed  COMPREHENSIVE METABOLIC  PANEL - Abnormal; Notable for the following:    Sodium 132 (*)    Chloride 99 (*)    Glucose, Bld 106 (*)    Creatinine, Ser 0.32 (*)    Calcium 8.6 (*)    Albumin 2.2 (*)    AST 63 (*)    ALT 78 (*)  Alkaline Phosphatase 179 (*)    All other components within normal limits  CBC WITH DIFFERENTIAL/PLATELET - Abnormal; Notable for the following:    WBC 22.8 (*)    Hemoglobin 11.2 (*)    MCHC 31.9 (*)    RDW 15.3 (*)    Platelets 505 (*)    Neutro Abs 18.9 (*)    Monocytes Absolute 2.3 (*)    All other components within normal limits  APTT - Abnormal; Notable for the following:    aPTT 39 (*)    All other components within normal limits  PROTIME-INR - Abnormal; Notable for the following:    Prothrombin Time 16.6 (*)    All other components within normal limits  CULTURE, BLOOD (ROUTINE X 2)  CULTURE, BLOOD (ROUTINE X 2)  URINE CULTURE  AEROBIC/ANAEROBIC CULTURE (SURGICAL/DEEP WOUND)  LACTIC ACID, PLASMA  LIPASE, BLOOD  TROPONIN I  LACTIC ACID, PLASMA  URINALYSIS COMPLETEWITH MICROSCOPIC ([facility] ONLY)   ____________________________________________  EKG  ED ECG REPORT I, Stanislaw Acton, the attending physician, personally viewed and interpreted this ECG.  Date: 11/27/2015 EKG Time: 05:04 Rate: 140 Rhythm: Sinus tachycardia QRS Axis: normal Intervals: normal ST/T Wave abnormalities: normal Conduction Disturbances: none Narrative Interpretation: unremarkable  ____________________________________________  RADIOLOGY   Dg Chest Port 1 View  11/27/2015  CLINICAL DATA:  55 year old female with sepsis EXAM: PORTABLE CHEST 1 VIEW COMPARISON:  Chest CT dated 09/28/2012 FINDINGS: Single portable view of the chest demonstrate minimal left lung base atelectatic changes. There is no focal consolidation, pleural effusion, or pneumothorax. The cardiac silhouette is within normal limits. No acute osseous pathology. IMPRESSION: No active disease. Electronically Signed   By: Elgie Collard M.D.   On: 11/27/2015 05:57    ____________________________________________   PROCEDURES  Procedure(s) performed:   .Critical Care Performed by: Loleta Rose Authorized by: Loleta Rose Total critical care time: 45 minutes Critical care time was exclusive of separately billable procedures and treating other patients. Critical care was necessary to treat or prevent imminent or life-threatening deterioration of the following conditions: sepsis. Critical care was time spent personally by me on the following activities: development of treatment plan with patient or surrogate, discussions with consultants, evaluation of patient's response to treatment, examination of patient, obtaining history from patient or surrogate, ordering and performing treatments and interventions, ordering and review of laboratory studies, ordering and review of radiographic studies, pulse oximetry, re-evaluation of patient's condition and review of old charts.     ____________________________________________   INITIAL IMPRESSION / ASSESSMENT AND PLAN / ED COURSE  Pertinent labs & imaging results that were available during my care of the patient were reviewed by me and considered in my medical decision making (see chart for details).  Empiric abx, 85mL/kg IVF, wound cultures.  Avoiding temp Foley given no hypothermia and hospital policy against unnecessary in-dwelling catheters.  Vomiting, giving Zofran.  Anticipate admission.  ----------------------------------------- 7:19 AM on 11/27/2015 -----------------------------------------  I spoke by phone with the patient's brother who is her power of attorney.  He agreed with plan for admission for IV fluids and IV antibiotics, but he confirmed that neither he he nor the patient would want [place] to proceed with more aggressive treatment.  We will not plan to place a central line or start pressors even if the patient's condition deteriorates.  He request  that he be kept in the loop (he lives in [place]).  Discussed the case with the hospitalist who evaluated the patient in the emergency department and admitted her.  ____________________________________________  FINAL CLINICAL IMPRESSION(S) / ED DIAGNOSES  Final diagnoses:  Sepsis, due to unspecified organism (HCC)  Decubitus ulcer of buttock, right, stage IV (HCC)  Wound infection (HCC)     MEDICATIONS GIVEN DURING THIS VISIT:  Medications  sodium chloride 0.9 % bolus 1,000 mL (0 mLs Intravenous Stopped 11/27/15 0654)    And  sodium chloride 0.9 % bolus 500 mL (0 mLs Intravenous Stopped 11/27/15 0645)  levofloxacin (LEVAQUIN) IVPB 750 mg (0 mg Intravenous Stopped 11/27/15 0646)  aztreonam (AZACTAM) 2 g in dextrose 5 % 50 mL IVPB (0 g Intravenous Stopped 11/27/15 0611)  vancomycin (VANCOCIN) IVPB 1000 mg/200 mL premix (0 mg Intravenous Stopped 11/27/15 0625)  ondansetron (ZOFRAN) injection 4 mg (4 mg Intravenous Given 11/27/15 0532)     NEW OUTPATIENT MEDICATIONS STARTED DURING THIS VISIT:  New Prescriptions   No medications on file      Note:  This document was prepared using Dragon voice recognition software and may include unintentional dictation errors.   Loleta Rose, MD 11/27/15 [phone]

## 2015-11-27 NOTE — Progress Notes (Signed)
[facility] [place] at [facility]   PATIENT NAME: Brittany Madden    MR#:  [id number]  DATE OF BIRTH:  12-18-1960  SUBJECTIVE:  CHIEF COMPLAINT:   Chief Complaint  Patient presents with  . 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 Patient was noted to be tachycardic in the emergency room with heart rates in the 160s and febrile. Her blood pressure was also found to be low at 90s over 60s, she was initiated on high rate IV fluids and somewhat improved. Her lower extremity as were found to be very cold to touch, very palpable peripheral pulses. Patient is nonverbal, unable to review systems  Review of Systems  Unable to perform ROS: patient nonverbal    VITAL SIGNS: Blood pressure 84/65, pulse 113, temperature 99.1 F (37.3 C), temperature source Rectal, resp. rate 28, height  (1.626 m), weight 49.85 kg (109 lb 14.4 oz), SpO2 100 %.  PHYSICAL EXAMINATION:   GENERAL:  55 y.o.-year-old patient lying in the bed with no acute distress, comfortable on the stretcher.  EYES: Pupils equal, round, reactive to light and accommodation. No scleral icterus. Extraocular muscles intact. Tries to mouth words, periorbital twitching HEENT: Head atraumatic, normocephalic. Oropharynx and nasopharynx clear.  NECK:  Supple, no jugular venous distention. No thyroid enlargement, no tenderness.  LUNGS: Normal breath sounds bilaterally, no wheezing, rales,rhonchi or crepitation. No use of accessory muscles of respiration.  CARDIOVASCULAR: S1, S2 normal. No murmurs, rubs, or gallops.  ABDOMEN: Soft, nontender, nondistended. Bowel sounds present. No organomegaly or mass.  EXTREMITIES: No pedal edema, cyanosis, or clubbing.  NEUROLOGIC:  Cranial nerves II through XII are grossly intact. Muscle strength unable to obtain in all extremities due to due to contractures . Sensation unable to evaluate this patient is not responding to verbal stimuli, nonverbal. Gait not checked. Bilateral upper and lower extremity contractures PSYCHIATRIC: The patient is alert and not able to assess orientation due to patient being nonverbal SKIN: Skin in sacral area reveals a few deep grade 3 ulcers with some serosanguineous discharge, no purulence was noted, however, increased warmth, as well as swelling in the perilcer area was noted. No significant pain on palpation, wound is saturated with urine as well  ORDERS/RESULTS REVIEWED:   CBC  Recent Labs Lab 11/27/15 0500  WBC 22.8*  HGB 11.2*  HCT 35.3  PLT 505*  MCV 81.8  MCH 26.1  MCHC 31.9*  RDW 15.3*  LYMPHSABS 1.6  MONOABS 2.3*  EOSABS 0.0  BASOSABS 0.0   ------------------------------------------------------------------------------------------------------------------  Chemistries   Recent Labs Lab 11/27/15 0500  NA 132*  K 3.9  CL 99*  CO2 24  GLUCOSE 106*  BUN 9  CREATININE 0.32*  CALCIUM 8.6*  AST 63*  ALT 78*  ALKPHOS 179*  BILITOT 0.8   ------------------------------------------------------------------------------------------------------------------ estimated creatinine clearance is 64.8 mL/min (by C-G formula based on Cr of 0.32). ------------------------------------------------------------------------------------------------------------------ No results for input(s): TSH, T4TOTAL, T3FREE, THYROIDAB in the last 72 hours.  Invalid input(s): FREET3  Cardiac Enzymes  Recent Labs Lab 11/27/15 0500  TROPONINI <0.03   ------------------------------------------------------------------------------------------------------------------ Invalid input(s):  POCBNP ---------------------------------------------------------------------------------------------------------------  RADIOLOGY: Dg Chest Port 1 View  11/27/2015  CLINICAL DATA:  55 year old female with sepsis EXAM: PORTABLE CHEST 1 VIEW COMPARISON:  Chest CT dated 09/28/2012 FINDINGS: Single portable view  of the chest demonstrate minimal left lung base atelectatic changes. There is no focal consolidation, pleural effusion, or pneumothorax. The cardiac silhouette is within normal limits. No acute osseous pathology. IMPRESSION: No active disease. Electronically Signed   By: Elgie Collard M.D.   On: 11/27/2015 05:57    EKG:  Orders placed or performed during the hospital encounter of 11/27/15  . EKG 12-Lead  . EKG 12-Lead    ASSESSMENT AND PLAN:  Principal Problem:   Sepsis (HCC) Active Problems:   Decubitus ulcer of sacral region, stage 3 (HCC)   Sepsis with multiple organ dysfunction (MOD) (HCC)   Neurogenic bladder #1. Sepsis due to decubitus infection, continue antibiotic therapy, blood cultures taken, getting 1 cultures, just antibodies. Depending on culture results. Get palliative care involved for further recommendations, patient would benefit from hospice follow-up in the facility #2. Hypotension, initiate Levophed as needed, continue IV fluids, watching patient's MAP closely, get Doppler studies of lower extremities to confirm arterial blood flow #3. Decubitus ulcers, infected, continue antibiotics, get wound cultures, get a wound specialist involved possibly surgery involved for recommendations #4. Neurogenic bladder with incontinence, place Foley catheter for now to avoid incontinence related maceration of the skin, patient may benefit from urology evaluation and urinary diversion #5. Hyponatremia, follow patient's sodium level in the morning #6. Leukocytosis, follow with antibody therapy #7. 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 Hypoglycemia, dietary consultation is obtained for resumption of tube feeds, continue patient on low rate IV fluids with dextrose for now  Management plans discussed with the patient, family and they are in agreement.   DRUG ALLERGIES:  Allergies  Allergen Reactions  . Asa [Aspirin] Other (See Comments)    Reaction: unknown  . Keflex [Cephalexin] Other (See Comments)    Reaction: unknown   . Nsaids Other (See Comments)    Reaction: unknown   . Other Other (See Comments)    SEAFOOD  . Salicylates Other (See Comments)    Reaction: unknown    CODE STATUS:     Code Status Orders        Start     Ordered   11/27/15 1131  Do not attempt resuscitation (DNR)   Continuous    Question Answer Comment  In the event of cardiac or respiratory ARREST Do not call a "code blue"   In the event of cardiac or respiratory ARREST Do not perform Intubation, CPR, defibrillation or ACLS   In the event of cardiac or respiratory ARREST Use medication by any route, position, wound care, and other measures to relive pain and suffering. May use oxygen, suction and manual treatment of airway obstruction as needed for comfort.      11/27/15 1130    Code Status History    Date Active Date Inactive Code Status Order ID Comments User Context   11/27/2015  5:20 AM 11/27/2015 11:30 AM DNR [id number]  Loleta Rose, MD ED   01/03/2015  5:49 AM 01/09/2015  4:31 PM DNR [id number]  Elenora Fender Ward, MD ED    Advance Directive Documentation        Most Recent Value   Type of Advance Directive  Out of facility DNR (pink MOST or yellow form)   Pre-existing out of facility DNR order (yellow form or pink MOST form)     "MOST" Form in Place?        TOTAL Critical care TIME TAKING CARE OF THIS PATIENT: 40 minutes.  Katharina Caper M.D on 11/27/2015 at 4:12 PM  Between 7am to 6pm - Pager - [phone]  After 6pm go to [email or web address] - password [email or web address] [facility]  [facility]  Office   [phone]  CC: Primary care physician; Lorrin Mais, MD

## 2015-11-27 NOTE — ED Notes (Signed)
Spoke to Dr. Meryl Crutch. Will put in parameters for pt HR and BP and fluid order.

## 2015-11-27 NOTE — ED Notes (Signed)
Social work at bedside.  

## 2015-11-27 NOTE — Clinical Social Work Note (Signed)
Clinical Social Work Assessment  Patient Details  Name: Brittany Madden MRN: [id number] Date of Birth: 06/23/1960  Date of referral:  11/27/15               Reason for consult:  Facility Placement                Permission sought to share information with:  Family Supports, Facility Contact Representative Permission granted to share information::  Yes, Verbal Permission Granted  Name::     Brittany Madden [phone]  Agency::  [facility]  Relationship::     Contact Information:     Housing/Transportation Living arrangements for the past 2 months:  Skilled Nursing Facility Source of Information:  Facility Patient Interpreter Needed:  None Criminal Activity/Legal Involvement Pertinent to Current Situation/Hospitalization:  No - Comment as needed Significant Relationships:  Siblings Lives with:   Facility Resident Do you feel safe going back to the place where you live?  Yes Need for family participation in patient care:  Yes (Comment)  Care giving concerns: TBD   Social Worker assessment / plan: LCSW met patient  ( 8:30am)and was unable to collect information (  Pt became lethargic) ED nurse suggested I call facility [facility], spoke to charge nurse and collected data. Patient has been at [facility] for 4 years. She is there long term care. Patient is verbal ( very soft spoken), she is oriented to self, place and her orientation fluctuates. She is on normal diet. She has been bedbound non ambulatory and required full ADL assists. She has G-tube for feeding. She is able to return upon discharge. Her insurance is  UHC/Medicare. She has good family support a brother Brittany and sister Rosalind and verbal consent was given by patient ( who nodded when I asked if I could call family and facility) Patient has a wheel chair but hasn't been out of bed for a lengthy time.Patient has an open ulcer on her buttox currently and is DNR patient.  Employment status:  Disabled (Comment  on whether or not currently receiving Disability) Insurance information:   UHC Medicare PT Recommendations:   TBD Information / Referral to community resources:   SNF  Patient/Family's Response to care:  TBD  Patient/Family's Understanding of and Emotional Response to Diagnosis, Current Treatment, and Prognosis: Family has been informed by facility/TBD  Emotional Assessment Appearance:  Appears stated age Attitude/Demeanor/Rapport:  Unable to Assess Affect (typically observed):  Unable to Assess (patient lethargic, spoke to Nurse at [facility]) Orientation:  Oriented to Self, Fluctuating Orientation (Suspected and/or reported Sundowners) Alcohol / Substance use:  Never Used Psych involvement (Current and /or in the community):  No (Comment)  Discharge Needs  Concerns to be addressed:  Care Coordination Readmission within the last 30 days:  No Current discharge risk:  None Barriers to Discharge:  Continued Medical Work up   Bandi, Claudine M, LCSW 11/27/2015, 11:39 AM  

## 2015-11-27 NOTE — Progress Notes (Signed)
Pharmacy Antibiotic Note  Brittany Madden is a 55 y.o. female admitted on 11/27/2015 with sepsis.  Pharmacy has been consulted for aztreonam, levofloxacin, and vancomycin dosing.  Plan: 1. Aztreonam 2 gm IV Q8H 2. Levofloxacin 750 mg IV Q24H 3. Vancomycin 1 gm IV x 1 in ED followed by vancomycin 500 mg IV Q12H, predicted trough 15 mcg/mL. Pharmacy will continue to follow and adjust as needed to maintain trough 15 to 20 mcg/mL.   Vd 35 L, Ke 0.058 hr-1, T1/2 11.9 hr  Height: 5\' 4"  (162.6 cm) Weight: 109 lb 14.4 oz (49.85 kg) IBW/kg (Calculated) : 54.7  Temp (24hrs), Avg:99.3 F (37.4 C), Min:97.9 F (36.6 C), Max:100.6 F (38.1 C)   Recent Labs Lab 11/27/15 0500  WBC 22.8*  CREATININE 0.32*  LATICACIDVEN 0.9    Estimated Creatinine Clearance: 64.8 mL/min (by C-G formula based on Cr of 0.32).    Allergies  Allergen Reactions  . Asa [Aspirin] Other (See Comments)    Reaction: unknown  . Keflex [Cephalexin] Other (See Comments)    Reaction: unknown   . Nsaids Other (See Comments)    Reaction: unknown   . Other Other (See Comments)    SEAFOOD  . Salicylates Other (See Comments)    Reaction: unknown    Thank you for allowing pharmacy to be a part of this patient's care.  Carola Frost, Pharm.D., BCPS Clinical Pharmacist 11/27/2015 7:26 AM

## 2015-11-27 NOTE — Progress Notes (Signed)
Spoke with Dr. Sheryle Hail about adding parameters to Metoprolol since pt's BP has been in the 90s/60s with a MAP of 66. Levo on hold until MAP bellow 65. MD said to hold dose at this time, no parameters added.

## 2015-11-27 NOTE — Progress Notes (Signed)
RN took over care of patient at 1500.  Patient currently in no apparent distress, vital signs stable, bp low but map consistently >65, not requiring levophed at this time.

## 2015-11-27 NOTE — ED Notes (Signed)
Spoke to Dr. Meryl Crutch. Reports he will call back in 15 min on update about pt being assigned stepdown/ccu bed.

## 2015-11-27 NOTE — [place] FL2 (Signed)
[place] MEDICAID [id number] LEVEL OF CARE SCREENING TOOL     IDENTIFICATION  Patient Name: Brittany Madden Birthdate: 12-24-1960 Sex: female Admission Date (Current Location): 11/27/2015  [place] and [place] Number:  Chiropodist and Address:  [facility], [address], [place], [place] [address]      Provider Number: [id number]  Attending Physician Name and Address:  Katharina Caper, MD  Relative Name and Phone Number:       Current Level of Care: Hospital Recommended Level of Care: Skilled Nursing Facility Prior Approval Number:    Date Approved/Denied:   PASRR Number: [id number]  Discharge Plan: SNF    Current Diagnoses: Patient Active Problem List   Diagnosis Date Noted  . Sepsis (HCC) 11/27/2015  . Decubitus ulcer of sacral region, stage 3 (HCC) 11/27/2015  . 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 Neurogenic bladder 11/27/2015  . Pressure ulcer 01/04/2015  . Anemia 01/04/2015  . Vaginal bleeding 01/03/2015  . Chronic anticoagulation 01/03/2015  . History of DVT (deep vein thrombosis) 01/03/2015  . History of pulmonary embolus (PE) 01/03/2015    Orientation RESPIRATION BLADDER Height & Weight     Self    Incontinent Weight: 109 lb 14.4 oz (49.85 kg) Height:  5\' 4"  (162.6 cm)  BEHAVIORAL SYMPTOMS/MOOD NEUROLOGICAL BOWEL NUTRITION STATUS      Incontinent Feeding tube  AMBULATORY STATUS COMMUNICATION OF NEEDS Skin   Extensive Assist Verbally (Very soft spoken ( whisper)) Other (Comment) (Ulcer on buttox)                       Personal Care Assistance Level of Assistance  Bathing, Feeding, Dressing, Total care Bathing Assistance: Maximum assistance Feeding assistance: Maximum assistance Dressing Assistance: Maximum assistance Total Care Assistance: Maximum assistance   Functional Limitations Info  Sight, Hearing, Speech Sight Info: Adequate Hearing Info: Adequate Speech Info: Adequate     SPECIAL CARE FACTORS FREQUENCY                       Contractures      Additional Factors Info  Code Status, Allergies Code Status Info: DNR Allergies Info: ASA,Keflex,Nsaids, saliccylates           Current Medications (11/27/2015):  This is the current hospital active medication list Current Facility-Administered Medications  Medication Dose Route Frequency Provider Last Rate Last Dose  . 0.9 %  sodium chloride infusion   Intravenous Continuous Altamese Dilling, MD 300 mL/hr at 11/27/15 0758    . 0.9 % NaCl with KCl 20 mEq/ L  infusion   Intravenous Continuous Debby Crosley, MD      . acetaminophen (TYLENOL) tablet 650 mg  650 mg Oral Q6H PRN Debby Crosley, MD       Or  . acetaminophen (TYLENOL) suppository 650 mg  650 mg Rectal Q6H PRN Debby Crosley, MD      . antiseptic oral rinse (BIOTENE) solution 15 mL  15 mL Mouth Rinse Q2H PRN Debby Crosley, MD      . aztreonam (AZACTAM) 2 g in dextrose 5 % 50 mL IVPB  2 g Intravenous Q8H Loleta Rose, MD      . escitalopram (LEXAPRO) tablet 10 mg  10 mg Per Tube Daily Katharina Caper, MD      . famotidine (PEPCID) IVPB 20 mg premix  20 mg Intravenous Q12H Katharina Caper, MD      . gabapentin (NEURONTIN) 250 MG/5ML solution 600 mg  600 mg Per Tube TID Gery Pray, MD      . Melene Muller ON 11/28/2015] levofloxacin (LEVAQUIN) IVPB 750 mg  750 mg Intravenous Q24H Loleta Rose, MD      . metoprolol tartrate (LOPRESSOR) tablet 12.5 mg  12.5 mg Oral BID Debby Crosley, MD      . norepinephrine (LEVOPHED)  in D5W premix infusion  0-40 mcg/min Intravenous Titrated Katharina Caper, MD      . polyvinyl alcohol (LIQUIFILM TEARS) 1.4 % ophthalmic solution 1 drop  1 drop Both Eyes BID Katharina Caper, MD      . sodium chloride flush (NS) 0.9 % injection 3 mL  3 mL Intravenous Q12H Debby Crosley, MD      . TWOCAL HN liquid 237 mL  237 mL Oral Q24H Debby Crosley, MD      . vancomycin (VANCOCIN) 500 mg in sodium chloride 0.9 % 100 mL IVPB  500  mg Intravenous Q12H Loleta Rose, MD         Discharge Medications: Please see discharge summary for a list of discharge medications.  Relevant Imaging Results:  Relevant Lab Results:   Additional Information SSN [id number]  Cheron Schaumann, [place]

## 2015-11-27 NOTE — Progress Notes (Signed)
Initial Nutrition Assessment     INTERVENTION:  -Recommend jevity 1.5 continuous at 69ml/hr goal rate. Will provide 2160 kcals, 92 g of protein and free water.  Recommend maintenance flush of 69ml q 4 hr at this time.  Will assess pt condition and need to alter free water flush and transition back to bolus feeding on follow-up.   NUTRITION DIAGNOSIS:   Increased nutrient needs related to wound healing as evidenced by estimated needs.    GOAL:   Patient will meet greater than or equal to 90% of their needs    MONITOR:   TF tolerance, Labs  REASON FOR ASSESSMENT:   Consult Enteral/tube feeding initiation and management  ASSESSMENT:      Pt admitted with bleeding sacral decubitus ulcer, septic. Pt with history of multiple sclerosis, functional quadraplegia, PEG tube in place  Spoke with pt brother Jorja Loa who is pt HCPOA. Reports pt does not take anything orally but all nutrition comes from feeding tube.  RN Selena Batten reports has been taken Two Cal HN 4 times daily and 1/2 can at 6am daily at [facility].    Medications reviewed: D5 NS with KCL at 81ml/hr Labs reveiwed: Na 132, creatinine 0.32, glucose 106   Unable to complete Nutrition-Focused physical exam at this time.    Diet Order:  Diet NPO time specified  Skin:   (pressure ulcer stage IV and unstageable on buttock)  Last BM:  PTA  Height:   Ht Readings from Last 1 Encounters:  11/27/15 5\' 4"  (1.626 m)    Weight: new bed wt taken by RN once in ICU of 114 pounds Brother reports UBW is 95-98 pounds, receives update about every 2 weeks from nursing home  Wt Readings from Last 1 Encounters:  11/27/15 109 lb 14.4 oz (49.85 kg)    Ideal Body Weight:     BMI:  Body mass index is 18.85 kg/(m^2).  Estimated Nutritional Needs:   Kcal:  1820-2080 kcals  Protein:  78-104 g/d  Fluid:  1.8-2 L/d  EDUCATION NEEDS:   No education needs identified at this time  Jenee Spaugh B. Freida Busman, RD, LDN [phone]  (pager) Weekend/On-Call pager [phone])

## 2015-11-28 DIAGNOSIS — L89153 Pressure ulcer of sacral region, stage 3: Secondary | ICD-10-CM

## 2015-11-28 DIAGNOSIS — N390 Urinary tract infection, site not specified: Secondary | ICD-10-CM | POA: Insufficient documentation

## 2015-11-28 DIAGNOSIS — A419 Sepsis, unspecified organism: Secondary | ICD-10-CM

## 2015-11-28 DIAGNOSIS — R6520 Severe sepsis without septic shock: Secondary | ICD-10-CM

## 2015-11-28 DIAGNOSIS — L89314 Pressure ulcer of right buttock, stage 4: Secondary | ICD-10-CM

## 2015-11-28 DIAGNOSIS — L89309 Pressure ulcer of unspecified buttock, unspecified stage: Secondary | ICD-10-CM | POA: Insufficient documentation

## 2015-11-28 LAB — GLUCOSE, CAPILLARY
GLUCOSE-CAPILLARY: 126 mg/dL — AB (ref 65–99)
GLUCOSE-CAPILLARY: 69 mg/dL (ref 65–99)
GLUCOSE-CAPILLARY: 84 mg/dL (ref 65–99)
Glucose-Capillary: 132 mg/dL — ABNORMAL HIGH (ref 65–99)
Glucose-Capillary: 145 mg/dL — ABNORMAL HIGH (ref 65–99)
Glucose-Capillary: 160 mg/dL — ABNORMAL HIGH (ref 65–99)
Glucose-Capillary: 170 mg/dL — ABNORMAL HIGH (ref 65–99)

## 2015-11-28 LAB — URINE CULTURE

## 2015-11-28 LAB — CBC
HCT: 31.3 % — ABNORMAL LOW (ref 35.0–47.0)
Hemoglobin: 9.7 g/dL — ABNORMAL LOW (ref 12.0–16.0)
MCH: 25.5 pg — AB (ref 26.0–34.0)
MCHC: 31.1 g/dL — AB (ref 32.0–36.0)
MCV: 82 fL (ref 80.0–100.0)
Platelets: 322 10*3/uL (ref 150–440)
RBC: 3.81 MIL/uL (ref 3.80–5.20)
RDW: 15.4 % — AB (ref 11.5–14.5)
WBC: 14.5 10*3/uL — ABNORMAL HIGH (ref 3.6–11.0)

## 2015-11-28 LAB — HEMOGLOBIN
HEMOGLOBIN: 9.2 g/dL — AB (ref 12.0–16.0)
HEMOGLOBIN: 8.6 g/dL — AB (ref 12.0–16.0)

## 2015-11-28 MED ORDER — DEXTROSE 5 % IV SOLN: 2.0000 g | Freq: Three times a day (TID) | INTRAVENOUS | Status: DC

## 2015-11-28 MED ORDER — DAKINS (1/2 STRENGTH) 0.25 % EX SOLN
Freq: Once | CUTANEOUS | Status: AC
  Administered 2015-11-28: 14:00:00
  Filled 2015-11-28: qty 473

## 2015-11-28 MED ORDER — LEVOFLOXACIN IN D5W 750 MG/150ML IV SOLN: 750.0000 mg | INTRAVENOUS | Status: DC

## 2015-11-28 MED ORDER — VANCOMYCIN HCL 500 MG IV SOLR: 500.0000 mg | Freq: Two times a day (BID) | INTRAVENOUS | Status: DC

## 2015-11-28 MED ORDER — DAKINS (1/4 STRENGTH) 0.125 % EX SOLN: Freq: Two times a day (BID) | CUTANEOUS | Status: DC

## 2015-11-28 NOTE — H&P (Signed)
Brittany Madden is an 55 y.o. female.   Chief Complaint: pressure ulcers HPI: 55 yr old female with advanced MS, now essentially causing paralysis and contracture.  Patient is on tube feeds at home and is severely malnourished.   Patient is non-verbal due to disease but can smile and nod head.  Patient was having some worsening mental status and bleeding from wounds which as well as fever and chills per chart review, which brought her to the hospital.  She has been admitted to the ICU with urosepsis and pressure ulcers. Surgery was asked to evaluate the wounds for possible debridement needs.   Past Medical History  Diagnosis Date  . MS (multiple sclerosis) ([place])   . Depression   . Functional quadriplegia (HCC)   . Neurogenic bladder   . PE (pulmonary embolism) 2012  . DVT (deep venous thrombosis) ([place]) 2012    Past Surgical History  Procedure Laterality Date  . Tonsillectomy    . Laparoscopic tubal ligation    . Examination under anesthesia Right 01/03/2015    Procedure: EXAM UNDER ANESTHESIA, repair of vaginal laceration ;  Surgeon: Honor Loh Ward, MD;  Location: [facility] ORS;  Service: Gynecology;  Laterality: Right;    No family history on file. Social History:  reports that she has quit smoking. She has never used smokeless tobacco. She reports that she does not drink alcohol or use illicit drugs.  Allergies:  Allergies  Allergen Reactions  . Asa [Aspirin] Other (See Comments)    Reaction: unknown  . Keflex [Cephalexin] Other (See Comments)    Reaction: unknown   . Nsaids Other (See Comments)    Reaction: unknown   . Other Other (See Comments)    SEAFOOD  . Salicylates Other (See Comments)    Reaction: unknown    Medications Prior to Admission  Medication Sig Dispense Refill  . antiseptic oral rinse (BIOTENE) LIQD 15 mLs by Mouth Rinse route every 2 (two) hours as needed for dry mouth.    . baclofen (LIORESAL) 20 MG tablet 20 mg by Feeding Tube route every 6 (six) hours.     . carboxymethylcellulose (REFRESH TEARS) 0.5 % SOLN Place 1 drop into both eyes 2 (two) times daily.    . clopidogrel (PLAVIX) 75 MG tablet Take 75 mg by mouth daily.    . coal tar (NEUTROGENA T-GEL) 0.5 % shampoo Apply 1 application topically 2 (two) times a week.    . Cranberry-Vitamin C-Inulin (UTI-STAT PO) Take 30 mLs by mouth daily.    . ergocalciferol (DRISDOL) 8000 UNIT/ML drops Take 50,000 Units by mouth every 30 (thirty) days.    Marland Kitchen escitalopram (LEXAPRO) 5 MG/5ML solution 10 mg by Feeding Tube route daily.    Marland Kitchen gabapentin (NEURONTIN) 250 MG/5ML solution Place 600 mg into feeding tube 3 (three) times daily.    . metoprolol tartrate (LOPRESSOR) 25 MG tablet Take 12.5 mg by mouth 2 (two) times daily.    . mineral oil-hydrophilic petrolatum (AQUAPHOR) ointment Apply 1 application topically daily. To feet and legs.    . Nutritional Supplements (TWOCAL HN) LIQD Take 237 mLs by mouth daily.    . potassium chloride SA (K-DUR,KLOR-CON) 20 MEQ tablet 20 mEq by Feeding Tube route daily. Dissolve in water then give via g-tube.    . ranitidine (ZANTAC) 75 MG tablet Take 75 mg by mouth at bedtime.      Results for orders placed or performed during the hospital encounter of 11/27/15 (from the past 48 hour(s))  Culture, blood (  Routine x 2)     Status: None (Preliminary result)   Collection Time: 11/27/15  4:55 AM  Result Value Ref Range   Specimen Description BLOOD LEFT ARM    Special Requests BOTTLES DRAWN AEROBIC AND ANAEROBIC 5CC    Culture NO GROWTH 1 DAY    Report Status PENDING   Comprehensive metabolic panel     Status: Abnormal   Collection Time: 11/27/15  5:00 AM  Result Value Ref Range   Sodium 132 (L) 135 - 145 mmol/L   Potassium 3.9 3.5 - 5.1 mmol/L   Chloride 99 (L) 101 - 111 mmol/L   CO2 24 22 - 32 mmol/L   Glucose, Bld 106 (H) 65 - 99 mg/dL   BUN 9 6 - 20 mg/dL   Creatinine, Ser 0.32 (L) 0.44 - 1.00 mg/dL   Calcium 8.6 (L) 8.9 - 10.3 mg/dL   Total Protein 8.1 6.5 - 8.1 g/dL    Albumin 2.2 (L) 3.5 - 5.0 g/dL   AST 63 (H) 15 - 41 U/L   ALT 78 (H) 14 - 54 U/L   Alkaline Phosphatase 179 (H) 38 - 126 U/L   Total Bilirubin 0.8 0.3 - 1.2 mg/dL   GFR calc non Af Amer >60 >60 mL/min   GFR calc Af Amer >60 >60 mL/min    Comment: (NOTE) The eGFR has been calculated using the CKD EPI equation. This calculation has not been validated in all clinical situations. eGFR's persistently <60 mL/min signify possible Chronic Kidney Disease.    Anion gap 9 5 - 15  Lactic acid, plasma     Status: None   Collection Time: 11/27/15  5:00 AM  Result Value Ref Range   Lactic Acid, Venous 0.9 0.5 - 2.0 mmol/L  CBC with Differential     Status: Abnormal   Collection Time: 11/27/15  5:00 AM  Result Value Ref Range   WBC 22.8 (H) 3.6 - 11.0 K/uL   RBC 4.31 3.80 - 5.20 MIL/uL   Hemoglobin 11.2 (L) 12.0 - 16.0 g/dL   HCT 35.3 35.0 - 47.0 %   MCV 81.8 80.0 - 100.0 fL   MCH 26.1 26.0 - 34.0 pg   MCHC 31.9 (L) 32.0 - 36.0 g/dL   RDW 15.3 (H) 11.5 - 14.5 %   Platelets 505 (H) 150 - 440 K/uL   Neutrophils Relative % 83 %   Lymphocytes Relative 7 %   Monocytes Relative 10 %   Eosinophils Relative 0 %   Basophils Relative 0 %   Neutro Abs 18.9 (H) 1.4 - 6.5 K/uL   Lymphs Abs 1.6 1.0 - 3.6 K/uL   Monocytes Absolute 2.3 (H) 0.2 - 0.9 K/uL   Eosinophils Absolute 0.0 0 - 0.7 K/uL   Basophils Absolute 0.0 0 - 0.1 K/uL   Smear Review MORPHOLOGY UNREMARKABLE   Culture, blood (Routine x 2)     Status: None (Preliminary result)   Collection Time: 11/27/15  5:00 AM  Result Value Ref Range   Specimen Description BLOOD LEFT HAND    Special Requests BOTTLES DRAWN AEROBIC AND ANAEROBIC 5CC    Culture NO GROWTH 1 DAY    Report Status PENDING   Lipase, blood     Status: None   Collection Time: 11/27/15  5:00 AM  Result Value Ref Range   Lipase 13 11 - 51 U/L  Troponin I     Status: None   Collection Time: 11/27/15  5:00 AM  Result Value Ref Range  Troponin I <0.03 <0.031 ng/mL     Comment:        NO INDICATION OF MYOCARDIAL INJURY.   APTT     Status: Abnormal   Collection Time: 11/27/15  5:00 AM  Result Value Ref Range   aPTT 39 (H) 24 - 36 seconds    Comment:        IF BASELINE aPTT IS ELEVATED, SUGGEST PATIENT RISK ASSESSMENT BE USED TO DETERMINE APPROPRIATE ANTICOAGULANT THERAPY.   Protime-INR     Status: Abnormal   Collection Time: 11/27/15  5:00 AM  Result Value Ref Range   Prothrombin Time 16.6 (H) 11.4 - 15.0 seconds   INR 1.33   Aerobic/Anaerobic Culture (surgical/deep wound)     Status: None (Preliminary result)   Collection Time: 11/27/15  5:23 AM  Result Value Ref Range   Specimen Description SACRAL DECUBITIS    Special Requests NONE    Gram Stain      ABUNDANT WBC PRESENT, PREDOMINANTLY PMN RARE SQUAMOUS EPITHELIAL CELLS PRESENT FEW GRAM POSITIVE COCCI IN PAIRS RARE GRAM VARIABLE ROD    Culture      CULTURE REINCUBATED FOR BETTER GROWTH Performed at [facility]    Report Status PENDING   Urine culture     Status: Abnormal   Collection Time: 11/27/15  5:30 AM  Result Value Ref Range   Specimen Description URINE, CATHETERIZED    Special Requests NONE    Culture MULTIPLE SPECIES PRESENT, SUGGEST RECOLLECTION (A)    Report Status 11/28/2015 FINAL   MRSA PCR Screening     Status: None   Collection Time: 11/27/15 11:30 AM  Result Value Ref Range   MRSA by PCR NEGATIVE NEGATIVE    Comment:        The GeneXpert MRSA Assay (FDA approved for NASAL specimens only), is one component of a comprehensive MRSA colonization surveillance program. It is not intended to diagnose MRSA infection nor to guide or monitor treatment for MRSA infections.   Aerobic Culture (superficial specimen)     Status: None (Preliminary result)   Collection Time: 11/27/15 11:30 AM  Result Value Ref Range   Specimen Description DECUBITIS ULCER RIGHT BUTTOCKS    Special Requests NONE    Gram Stain      ABUNDANT WBC PRESENT, PREDOMINANTLY PMN NO  SQUAMOUS EPITHELIAL CELLS SEEN FEW GRAM POSITIVE COCCI IN PAIRS RARE GRAM NEGATIVE RODS RARE GRAM POSITIVE RODS    Culture      CULTURE REINCUBATED FOR BETTER GROWTH Performed at [facility]    Report Status PENDING   Glucose, capillary     Status: None   Collection Time: 11/27/15 11:43 AM  Result Value Ref Range   Glucose-Capillary 69 65 - 99 mg/dL  Glucose, capillary     Status: Abnormal   Collection Time: 11/27/15  1:30 PM  Result Value Ref Range   Glucose-Capillary 115 (H) 65 - 99 mg/dL  Hemoglobin     Status: Abnormal   Collection Time: 11/27/15  4:35 PM  Result Value Ref Range   Hemoglobin 9.8 (L) 12.0 - 16.0 g/dL  Urinalysis complete, with microscopic     Status: Abnormal   Collection Time: 11/27/15  4:36 PM  Result Value Ref Range   Color, Urine AMBER (A) YELLOW   APPearance CLOUDY (A) CLEAR   Glucose, UA NEGATIVE NEGATIVE mg/dL   Bilirubin Urine NEGATIVE NEGATIVE   Ketones, ur NEGATIVE NEGATIVE mg/dL   Specific Gravity, Urine 1.014 1.005 - 1.030   Hgb  urine dipstick NEGATIVE NEGATIVE   pH 5.0 5.0 - 8.0   Protein, ur NEGATIVE NEGATIVE mg/dL   Nitrite NEGATIVE NEGATIVE   Leukocytes, UA 3+ (A) NEGATIVE   RBC / HPF 6-30 0 - 5 RBC/hpf   WBC, UA TOO NUMEROUS TO COUNT 0 - 5 WBC/hpf   Bacteria, UA NONE SEEN NONE SEEN   Squamous Epithelial / LPF 0-5 (A) NONE SEEN   WBC Clumps PRESENT    Mucous PRESENT    Hyaline Casts, UA PRESENT   Glucose, capillary     Status: None   Collection Time: 11/27/15  5:07 PM  Result Value Ref Range   Glucose-Capillary 80 65 - 99 mg/dL  Comprehensive metabolic panel     Status: Abnormal   Collection Time: 11/27/15  6:57 PM  Result Value Ref Range   Sodium 137 135 - 145 mmol/L   Potassium 4.6 3.5 - 5.1 mmol/L   Chloride 110 101 - 111 mmol/L   CO2 22 22 - 32 mmol/L   Glucose, Bld 79 65 - 99 mg/dL   BUN 5 (L) 6 - 20 mg/dL   Creatinine, Ser <0.30 (L) 0.44 - 1.00 mg/dL   Calcium 7.9 (L) 8.9 - 10.3 mg/dL   Total Protein 6.5 6.5 -  8.1 g/dL   Albumin 1.8 (L) 3.5 - 5.0 g/dL   AST 40 15 - 41 U/L   ALT 49 14 - 54 U/L   Alkaline Phosphatase 136 (H) 38 - 126 U/L   Total Bilirubin 1.1 0.3 - 1.2 mg/dL   GFR calc non Af Amer NOT CALCULATED >60 mL/min   GFR calc Af Amer NOT CALCULATED >60 mL/min    Comment: (NOTE) The eGFR has been calculated using the CKD EPI equation. This calculation has not been validated in all clinical situations. eGFR's persistently <60 mL/min signify possible Chronic Kidney Disease.    Anion gap 5 5 - 15  Glucose, capillary     Status: None   Collection Time: 11/27/15  8:00 PM  Result Value Ref Range   Glucose-Capillary 92 65 - 99 mg/dL  Glucose, capillary     Status: Abnormal   Collection Time: 11/28/15 12:10 AM  Result Value Ref Range   Glucose-Capillary 132 (H) 65 - 99 mg/dL  CBC     Status: Abnormal   Collection Time: 11/28/15 12:59 AM  Result Value Ref Range   WBC 14.5 (H) 3.6 - 11.0 K/uL   RBC 3.81 3.80 - 5.20 MIL/uL   Hemoglobin 9.7 (L) 12.0 - 16.0 g/dL   HCT 31.3 (L) 35.0 - 47.0 %   MCV 82.0 80.0 - 100.0 fL   MCH 25.5 (L) 26.0 - 34.0 pg   MCHC 31.1 (L) 32.0 - 36.0 g/dL   RDW 15.4 (H) 11.5 - 14.5 %   Platelets 322 150 - 440 K/uL    Comment: COUNT MAY BE INACCURATE DUE TO FIBRIN CLUMPS.  Glucose, capillary     Status: Abnormal   Collection Time: 11/28/15  3:57 AM  Result Value Ref Range   Glucose-Capillary 170 (H) 65 - 99 mg/dL  Glucose, capillary     Status: Abnormal   Collection Time: 11/28/15  7:26 AM  Result Value Ref Range   Glucose-Capillary 160 (H) 65 - 99 mg/dL  Hemoglobin     Status: Abnormal   Collection Time: 11/28/15  7:44 AM  Result Value Ref Range   Hemoglobin 9.2 (L) 12.0 - 16.0 g/dL  Glucose, capillary     Status: Abnormal  Collection Time: 11/28/15 12:11 PM  Result Value Ref Range   Glucose-Capillary 145 (H) 65 - 99 mg/dL  Hemoglobin     Status: Abnormal   Collection Time: 11/28/15  3:56 PM  Result Value Ref Range   Hemoglobin 8.6 (L) 12.0 - 16.0  g/dL   Dg Chest Port 1 View  11/27/2015  CLINICAL DATA:  55 year old female with sepsis EXAM: PORTABLE CHEST 1 VIEW COMPARISON:  Chest CT dated 09/28/2012 FINDINGS: Single portable view of the chest demonstrate minimal left lung base atelectatic changes. There is no focal consolidation, pleural effusion, or pneumothorax. The cardiac silhouette is within normal limits. No acute osseous pathology. IMPRESSION: No active disease. Electronically Signed   By: Anner Crete M.D.   On: 11/27/2015 05:57    Review of Systems  Unable to perform ROS: medical condition  Constitutional: Positive for fever, chills and malaise/fatigue.  Skin: Negative for itching and rash.  Neurological: Positive for focal weakness and weakness.  All other systems reviewed and are negative.   Blood pressure 105/62, pulse 117, temperature 99.7 F (37.6 C), temperature source Core (Comment), resp. rate 27, height '5\' 4"'$  (1.626 m), weight 117 lb 11.6 oz (53.4 kg), SpO2 100 %. Physical Exam  Vitals reviewed. Constitutional: She appears well-developed. No distress.  HENT:  Head: Normocephalic and atraumatic.  Right Ear: External ear normal.  Left Ear: External ear normal.  Nose: Nose normal.  Mouth/Throat: Oropharynx is clear and moist. No oropharyngeal exudate.  Eyes: Conjunctivae are normal. Pupils are equal, round, and reactive to light. No scleral icterus.  Neck: Neck supple. No tracheal deviation present.  Cardiovascular: Normal rate, regular rhythm, normal heart sounds and intact distal pulses.   No murmur heard. Respiratory: Effort normal and breath sounds normal. No respiratory distress. She has no wheezes.  GI: Soft. She exhibits no distension. There is no tenderness.  Musculoskeletal:  Contracted and unable to move due to advanced MS  Neurological: She displays abnormal reflex. She exhibits abnormal muscle tone. Coordination abnormal.  Contracted and minimal movement due to advanced MS  Skin: Skin is warm  and dry.  Sacral decubitus stage III clean and dry, santyl dressings no necrotic material  Right ischial wound: some necrosis in wound bed, tender to palpation, no erythema surrounding but some undermining laterally but skin still intact, ischium palpable and visible through wound bed     Assessment/Plan 55 yr old with advanced MS, paralysis, urosepsis and decubitus ulcers and severe malnutrition.  The right ischial wound does have some necrotic material in bed, ischium can be clearly seen in wound bed and some undermining, but very tender to palpation.  At this time I would recommend dakin's soaked gauze to the wound BID to help debride some of the necrotic material.  Also would recommend MRI to determine if ischial osteomyelitis present as this would change the time antibiotics would be needed.  Unfortunately given her current medical condition and severe malnutrition operative intervention would be high risk and likely would not offer much improvement in quality of life.  Would recommend high protein nutrition as well.    Hubbard Robinson, MD 11/28/2015, 5:21 PM

## 2015-11-28 NOTE — Progress Notes (Signed)
Pharmacy Antibiotic Note  Brittany Madden is a 55 y.o. female admitted on 11/27/2015 with sepsis.  Pharmacy has been consulted for aztreonam, levofloxacin, and vancomycin dosing.  Plan: 1. Aztreonam 2 gm IV Q8H 2. Levofloxacin 750 mg IV Q24H 3. Vvancomycin 500 mg IV Q12H, predicted trough 15 mcg/mL. Pharmacy will continue to follow and adjust as needed to maintain trough 15 to 20 mcg/mL.   Vd 35 L, Ke 0.058 hr-1, T1/2 11.9 hr  Height:  (162.6 cm) Weight: 109 lb 14.4 oz (49.85 kg) IBW/kg (Calculated) : 54.7  Temp (24hrs), Avg:99.5 F (37.5 C), Min:97.9 F (36.6 C), Max:100.6 F (38.1 C)   Recent Labs Lab 11/27/15 0500 11/27/15 1857 11/28/15 0059  WBC 22.8*  --  14.5*  CREATININE 0.32* <0.30*  --   LATICACIDVEN 0.9  --   --     CrCl cannot be calculated (Patient has no serum creatinine result on file.).    Allergies  Allergen Reactions  . Asa [Aspirin] Other (See Comments)    Reaction: unknown  . Keflex [Cephalexin] Other (See Comments)    Reaction: unknown   . Nsaids Other (See Comments)    Reaction: unknown   . Other Other (See Comments)    SEAFOOD  . Salicylates Other (See Comments)    Reaction: unknown    Thank you for allowing pharmacy to be a part of this patient's care.  Shelisa Fern D, Pharm.D., BCPS Clinical Pharmacist 11/28/2015 11:03 AM

## 2015-11-28 NOTE — Progress Notes (Signed)
Pt has remained alert. Unable to follow commands, but is able to nod head yes and no. Lung sounds have been clear to auscultation, weaned to RA sating 100%. NAD noted. ST on the cardiac monitor. BP has fluctuated; however, the map has consistently remained above 65. Wound care has been performed twice this shift due to copious amounts of secretions from the sacral ulcers.

## 2015-11-28 NOTE — Progress Notes (Addendum)
[facility] - [place] at [facility]   PATIENT NAME: Brittany Madden    MR#:  [id number]  DATE OF BIRTH:  1960-11-12  SUBJECTIVE:  CHIEF COMPLAINT:   Chief Complaint  Patient presents with  . 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 Patient was noted to be tachycardic in the emergency room with heart rates in the 160s and febrile. Her blood pressure was also found to be low in 90s over 60s, she was initiated on high rate IV fluids and somewhat improved. Heart rate, however, remains high at 120, blood pressure is stable in 90s. Patient is not on pressors. Patient was seen by wound care specialist and recommended surgical evaluation, discussed with Dr. Weston Brass and patient was examined together with her, wound and sacral area reaches bone, concerning for osteomyelitis. Wound culture is pending, blood cultures are negative so far, patient is on vancomycin and Zosyn. She seemed to be more responsive, able to mouth simple questions.   Review of Systems  Unable to perform ROS: language    VITAL SIGNS: Blood pressure 91/69, pulse 117, temperature 100 F (37.8 C), temperature source Core (Comment), resp. rate 26, height 5\' 4"  (1.626 m), weight 49.85 kg (109 lb 14.4 oz), SpO2 100 %.  PHYSICAL EXAMINATION:   GENERAL:  55 y.o.-year-old patient lying in the bed with no acute distress. Smiles, able to answer a few questions appropriately, more alert today EYES: Pupils equal, round, reactive to light and accommodation. No scleral icterus. Extraocular muscles intact.  HEENT: Head atraumatic, normocephalic. Oropharynx and nasopharynx clear.  NECK:  Supple, no jugular venous distention. No thyroid enlargement, no tenderness.  LUNGS:  Normal breath sounds bilaterally, no wheezing, rales,rhonchi or crepitation. No use of accessory muscles of respiration.  CARDIOVASCULAR: S1, S2 normal. No murmurs, rubs, or gallops.  ABDOMEN: Soft, nontender, nondistended. Bowel sounds present. No organomegaly or mass. PEG tube in right upper quadrant EXTREMITIES: No pedal edema, cyanosis, or clubbing.  NEUROLOGIC: Cranial nerves II through XII are intact. Muscle strength 5/5 in all extremities. Sensation intact. Gait not checked.  PSYCHIATRIC: The patient is alert today, smiles upon me entering the room.  SKIN: No obvious rash, 2 deep ulcers 1. reaching sacral bone was noted in the back. Foley catheter is draining amber color urine  ORDERS/RESULTS REVIEWED:   CBC  Recent Labs Lab 11/27/15 0500 11/27/15 1635 11/28/15 0059 11/28/15 0744  WBC 22.8*  --  14.5*  --   HGB 11.2* 9.8* 9.7* 9.2*  HCT 35.3  --  31.3*  --   PLT 505*  --  322  --   MCV 81.8  --  82.0  --   MCH 26.1  --  25.5*  --   MCHC 31.9*  --  31.1*  --   RDW 15.3*  --  15.4*  --   LYMPHSABS 1.6  --   --   --   MONOABS 2.3*  --   --   --   EOSABS 0.0  --   --   --   BASOSABS 0.0  --   --   --    ------------------------------------------------------------------------------------------------------------------  Chemistries   Recent Labs Lab 11/27/15 0500 11/27/15 1857  NA 132* 137  K 3.9 4.6  CL 99* 110  CO2 24 22  GLUCOSE 106* 79  BUN 9 5*  CREATININE 0.32* <0.30*  CALCIUM 8.6* 7.9*  AST 63* 40  ALT 78* 49  ALKPHOS 179* 136*  BILITOT 0.8 1.1   ------------------------------------------------------------------------------------------------------------------ CrCl cannot be calculated (Patient has no serum creatinine result on file.). ------------------------------------------------------------------------------------------------------------------ No results for input(s): TSH, T4TOTAL, T3FREE, THYROIDAB in the last 72 hours.  Invalid input(s):  FREET3  Cardiac Enzymes  Recent Labs Lab 11/27/15 0500  TROPONINI <0.03   ------------------------------------------------------------------------------------------------------------------ Invalid input(s): POCBNP ---------------------------------------------------------------------------------------------------------------  RADIOLOGY: Dg Chest Port 1 View  11/27/2015  CLINICAL DATA:  55 year old female with sepsis EXAM: PORTABLE CHEST 1 VIEW COMPARISON:  Chest CT dated 09/28/2012 FINDINGS: Single portable view of the chest demonstrate minimal left lung base atelectatic changes. There is no focal consolidation, pleural effusion, or pneumothorax. The cardiac silhouette is within normal limits. No acute osseous pathology. IMPRESSION: No active disease. Electronically Signed   By: Elgie Collard M.D.   On: 11/27/2015 05:57    EKG:  Orders placed or performed during the hospital encounter of 11/27/15  . EKG 12-Lead  . EKG 12-Lead    ASSESSMENT AND PLAN:  Principal Problem:   Sepsis (HCC) Active Problems:   Decubitus ulcer of sacral region, stage 3 (HCC)   Sepsis with multiple organ dysfunction (MOD) (HCC)   Neurogenic bladder #1. Sepsis, concerning for wound infection, even osteomyelitis, continue antibiotic therapy with broad-spectrum medications,Including Azactam, levofloxacin, vancomycin . Blood cultures are negative so far, blood cultures are pending.. Get ID consult. Get MRI of the bone to rule out osteo- #2. Sacral decubitus, stage III and 4, concerning for wound infection, even osteomyelitis as above, we are going to get MRI of the bone to rule out osteomyelitis #3. Hyponatremia, resolved with IV fluid administration #4. Severe malnutrition with albumin level of 1.8 with rehydration, continue tube feeds, however suspect loss of albumin with wound drainage, the patient is unlikely to heal her wounds, and she is poor candidate for any kind of surgical procedure, discussed with  surgeon #5. Hypotension, stable with therapy #6. Sinus tachycardia, initially thought to be due to sepsis, could be SSRI related, discontinue Lexapro if tachycardia does not improve with sepsis therapy.  #7. Acute posthemorrhagic anemia, about 2 g drop over the past 24 hours, bleeding has subsided, transfuse patient as needed Management plans discussed with the patient, family and they are in agreement.   DRUG ALLERGIES:  Allergies  Allergen Reactions  . Asa [Aspirin] Other (See Comments)    Reaction: unknown  . Keflex [Cephalexin] Other (See Comments)    Reaction: unknown   . Nsaids Other (See Comments)    Reaction: unknown   . Other Other (See Comments)    SEAFOOD  . Salicylates Other (See Comments)    Reaction: unknown    CODE STATUS:     Code Status Orders        Start     Ordered   11/27/15 1131  Do not attempt resuscitation (DNR)   Continuous    Question Answer Comment  In the event of cardiac or respiratory ARREST Do not call a "code blue"   In the event of cardiac or respiratory ARREST Do not perform Intubation, CPR, defibrillation or ACLS   In the event of cardiac or respiratory ARREST Use medication by any route, position, wound care, and other measures to relive pain and suffering. May use oxygen, suction and manual treatment of airway obstruction as needed for comfort.      11/27/15 1130  Code Status History    Date Active Date Inactive Code Status Order ID Comments User Context   11/27/2015  5:20 AM 11/27/2015 11:30 AM DNR [id number]  Loleta Rose, MD ED   01/03/2015  5:49 AM 01/09/2015  4:31 PM DNR [id number]  Elenora Fender Ward, MD ED    Advance Directive Documentation        Most Recent Value   Type of Advance Directive  Out of facility DNR (pink MOST or yellow form)   Pre-existing out of facility DNR order (yellow form or pink MOST form)     "MOST" Form in Place?        TOTAL critical care TIME TAKING CARE OF THIS PATIENT: 40  minutes.  discussed with Dr.  Maren Reamer M.D on 11/28/2015 at 2:18 PM  Between 7am to 6pm - Pager - [phone]  After 6pm go to [email or web address] - password [email or web address] [facility]  [facility]  Office  [phone]  CC: Primary care physician; Lorrin Mais, MD

## 2015-11-28 NOTE — Progress Notes (Signed)
BP continues to be low but MAP at or above 65. Continue on IVF, IV ABX, and tube feeding. Turned and repositioned for stage 3 decubs. Pt nonverbal, but opens eyes spontaneously. Continue to monitor.

## 2015-11-28 NOTE — Progress Notes (Signed)
Report given to [facility], RN who is now taking over patient's care.

## 2015-11-29 ENCOUNTER — Inpatient Hospital Stay: Payer: Medicare Other

## 2015-11-29 LAB — CBC
HCT: 22.2 % — ABNORMAL LOW (ref 35.0–47.0)
Hemoglobin: 7.5 g/dL — ABNORMAL LOW (ref 12.0–16.0)
MCH: 27.4 pg (ref 26.0–34.0)
MCHC: 33.7 g/dL (ref 32.0–36.0)
MCV: 81.5 fL (ref 80.0–100.0)
Platelets: 348 10*3/uL (ref 150–440)
RBC: 2.73 MIL/uL — ABNORMAL LOW (ref 3.80–5.20)
RDW: 15.4 % — AB (ref 11.5–14.5)
WBC: 9.1 10*3/uL (ref 3.6–11.0)

## 2015-11-29 LAB — GLUCOSE, CAPILLARY
GLUCOSE-CAPILLARY: 107 mg/dL — AB (ref 65–99)
Glucose-Capillary: 105 mg/dL — ABNORMAL HIGH (ref 65–99)
Glucose-Capillary: 107 mg/dL — ABNORMAL HIGH (ref 65–99)
Glucose-Capillary: 109 mg/dL — ABNORMAL HIGH (ref 65–99)
Glucose-Capillary: 111 mg/dL — ABNORMAL HIGH (ref 65–99)
Glucose-Capillary: 117 mg/dL — ABNORMAL HIGH (ref 65–99)

## 2015-11-29 LAB — PREPARE RBC (CROSSMATCH)

## 2015-11-29 LAB — HEMOGLOBIN AND HEMATOCRIT, BLOOD
HCT: 23.6 % — ABNORMAL LOW (ref 35.0–47.0)
Hemoglobin: 7.7 g/dL — ABNORMAL LOW (ref 12.0–16.0)

## 2015-11-29 LAB — VANCOMYCIN, TROUGH: VANCOMYCIN TR: 4 ug/mL — AB (ref 10–20)

## 2015-11-29 MED ORDER — GADOBENATE DIMEGLUMINE 529 MG/ML IV SOLN
10.0000 mL | Freq: Once | INTRAVENOUS | Status: AC | PRN
  Administered 2015-11-29: 10 mL via INTRAVENOUS

## 2015-11-29 MED ORDER — VANCOMYCIN HCL IN DEXTROSE 750-5 MG/150ML-% IV SOLN
750.0000 mg | Freq: Three times a day (TID) | INTRAVENOUS | Status: DC
  Administered 2015-11-29 – 2015-12-01 (×6): 750 mg via INTRAVENOUS
  Filled 2015-11-29 (×9): qty 150

## 2015-11-29 MED ORDER — METOPROLOL TARTRATE 25 MG PO TABS
12.5000 mg | ORAL_TABLET | Freq: Four times a day (QID) | ORAL | Status: DC
  Administered 2015-11-29 – 2015-12-01 (×8): 12.5 mg via ORAL
  Filled 2015-11-29 (×8): qty 1

## 2015-11-29 MED ORDER — PRO-STAT SUGAR FREE PO LIQD
30.0000 mL | Freq: Every day | ORAL | Status: DC
  Administered 2015-11-29 – 2015-12-01 (×3): 30 mL

## 2015-11-29 MED ORDER — SODIUM CHLORIDE 0.9 % IV SOLN
Freq: Once | INTRAVENOUS | Status: AC
  Administered 2015-11-29: 20:00:00 via INTRAVENOUS

## 2015-11-29 NOTE — Progress Notes (Signed)
Per MD Nicholos Johns ok to insert PICC in IJ; arms are contracted

## 2015-11-29 NOTE — Progress Notes (Signed)
Foley catheter had to be replaced due to patient going to MRI as temperature foley not MRI compatible. Foley catheter re-insertion took 4 attempts due to difficult anatomy (both patient's contractures which prohibited patient from being able to spread legs for better visualization and just general perineal anatomy).

## 2015-11-29 NOTE — Progress Notes (Signed)
Per MD Winona Legato. patiant is stable enough to transfer to Med Surg at this time; she will probably remain tachycardic until septic arthritis is resolved.  But it is ok to keep her on step down as long as ICU does not need beds.  Patient will be receiving a unit of blood

## 2015-11-29 NOTE — Progress Notes (Signed)
Per NP Bincey no intervention at this time for tachycardia in 120s and tachypnea in 30s

## 2015-11-29 NOTE — Progress Notes (Signed)
Pharmacy Antibiotic Note  Brittany Madden is a 55 y.o. female admitted on 11/27/2015 with sepsis.  Pharmacy has been consulted for aztreonam, levofloxacin, and vancomycin dosing.  Plan: Vancomycin trough 4 mcg/mL. Adjusted Ke approximately 0.122 hr-1. Changed dose to 750 mg IV Q8H, predicted trough 15 mcg/mL. Pharmacy will continue to monitor and adjust as needed to maintain trough 15 to 20 mcg/mL.  Height: 5\' 4"  (162.6 cm) Weight: 117 lb 11.6 oz (53.4 kg) IBW/kg (Calculated) : 54.7  Temp (24hrs), Avg:99.6 F (37.6 C), Min:98.6 F (37 C), Max:100.6 F (38.1 C)   Recent Labs Lab 11/27/15 0500 11/27/15 1857 11/28/15 0059 11/29/15 0455  WBC 22.8*  --  14.5* 9.1  CREATININE 0.32* <0.30*  --   --   LATICACIDVEN 0.9  --   --   --   VANCOTROUGH  --   --   --  4*    CrCl cannot be calculated (Patient has no serum creatinine result on file.).    Allergies  Allergen Reactions  . Asa [Aspirin] Other (See Comments)    Reaction: unknown  . Keflex [Cephalexin] Other (See Comments)    Reaction: unknown   . Nsaids Other (See Comments)    Reaction: unknown   . Other Other (See Comments)    SEAFOOD  . Salicylates Other (See Comments)    Reaction: unknown    Thank you for allowing pharmacy to be a part of this patient's care.  Carola Frost, Pharm.D., BCPS Clinical Pharmacist 11/29/2015 5:38 AM

## 2015-11-29 NOTE — Progress Notes (Signed)
Nutrition Follow-up  DOCUMENTATION CODES:      INTERVENTION:  -TF: current TF prescription provides 40 kcals/kg current wt and 1.74 g of protein/kg of current body weight. Will add Prostat daily for additional 15 g of protein, 100 kcals (total 2.0 g protein/kg body weight). Continue to assess   NUTRITION DIAGNOSIS:   Increased nutrient needs related to wound healing as evidenced by estimated needs.  Being addressed via TF  GOAL:   Patient will meet greater than or equal to 90% of their needs  MONITOR:   TF tolerance, Labs  REASON FOR ASSESSMENT:   Consult Enteral/tube feeding initiation and management  ASSESSMENT:    Surgery evaluated wound for possible debridement, MRI pelvis pending Tolerating Jevity 1.5 continuous feedings at rate of 60 ml/hr via PEG tube  Sp[facility] with RN at [facility] who reports that pt had been tolerating TwoCal HN bolus feedings and had been gaining weight. Pt had not been taking anything by mouth for about 1 year  Diet Order:  Diet NPO time specified  Skin:   (pressure ulcer stage IV and unstageable on buttock)  Last BM:  PTA   Labs: Hgb trending down  Glucose Profile:   Recent Labs  11/28/15 2358 11/29/15 0402 11/29/15 0724  GLUCAP 126* 105* 111*   Meds: levophed ordered but not infusing  Height:   Ht Readings from Last 1 Encounters:  11/27/15  (1.626 m)    Weight:   Wt Readings from Last 1 Encounters:  11/29/15 116 lb 13.5 oz (53 kg)    Filed Weights   11/27/15 0501 11/28/15 1600 11/29/15 0500  Weight: 109 lb 14.4 oz (49.85 kg) 117 lb 11.6 oz (53.4 kg) 116 lb 13.5 oz (53 kg)    BMI:  Body mass index is 20.05 kg/(m^2).  Estimated Nutritional Needs:   Kcal:  1820-2080 kcals  Protein:  78-104 g/d  Fluid:  1.8-2 L/d  EDUCATION NEEDS:   No education needs identified at this time  Romelle Starcher MS, RD, LDN [phone] Pager  [phone] Weekend/On-Call Pager

## 2015-11-29 NOTE — Progress Notes (Signed)
Brother Elvis Coil called by this RN and Kathi Der, RN.  He gave consent for PICC in jugular, and transfuse packed RBCs

## 2015-11-29 NOTE — Progress Notes (Signed)
CC: Decubitus ulcers Subjective: 55 year old female with advanced MS. Surgery consult at for evaluation of decubitus ulcers to her right hip. Patient is nonverbal. Patient seen and evaluated with the nursing staff as they're performing her dressing changes.  Objective: Vital signs in last 24 hours: Temp:  [98.6 F (37 C)-100.6 F (38.1 C)] 99.1 F (37.3 C) (06/26 0800) Pulse Rate:  [99-126] 114 (06/26 0800) Resp:  [18-31] 31 (06/26 0800) BP: (84-113)/(52-71) 105/63 mmHg (06/26 0800) SpO2:  [96 %-100 %] 97 % (06/26 0800) Weight:  [53 kg (116 lb 13.5 oz)-53.4 kg (117 lb 11.6 oz)] 53 kg (116 lb 13.5 oz) (06/26 0500) Last BM Date:  (ukn)  Intake/Output from previous day: 06/25 0701 - 06/26 0700 In: 2679.3 [I.V.:509.3; NG/GT:1500; IV Piggyback:500] Out: 905 [Urine:905] Intake/Output this shift: Total I/O In: 60 [NG/GT:60] Out: 125 [Urine:125]  Physical exam:  Gen. no acute distress Skin: Right Sacral decubitus stage III clean and dry with no obvious necrotic material Right ischial wound: Minimal deep necrosis in wound bed, tender to palpation, no spreading erythema but some undermining laterally, ischium palpable and visible through wound bed   Lab Results: CBC   Recent Labs  11/28/15 0059  11/28/15 1556 11/29/15 0455  WBC 14.5*  --   --  9.1  HGB 9.7*  < > 8.6* 7.5*  HCT 31.3*  --   --  22.2*  PLT 322  --   --  348  < > = values in this interval not displayed. BMET  Recent Labs  11/27/15 0500 11/27/15 1857  NA 132* 137  K 3.9 4.6  CL 99* 110  CO2 24 22  GLUCOSE 106* 79  BUN 9 5*  CREATININE 0.32* <0.30*  CALCIUM 8.6* 7.9*   PT/INR  Recent Labs  11/27/15 0500  LABPROT 16.6*  INR 1.33   ABG No results for input(s): PHART, HCO3 in the last 72 hours.  Invalid input(s): PCO2, PO2  Studies/Results: No results found.  Anti-infectives: Anti-infectives    Start     Dose/Rate Route Frequency Ordered Stop   11/29/15 0600  vancomycin (VANCOCIN) IVPB 750  mg/150 ml premix     750 mg 150 mL/hr over 60 Minutes Intravenous Every 8 hours 11/29/15 0538     11/28/15 1000  levofloxacin (LEVAQUIN) IVPB 750 mg     750 mg 100 mL/hr over 90 Minutes Intravenous Every 24 hours 11/27/15 1135     11/28/15 0845  levofloxacin (LEVAQUIN) IVPB 750 mg  Status:  Discontinued     750 mg 100 mL/hr over 90 Minutes Intravenous Every 24 hours 11/28/15 0836 11/28/15 0846   11/28/15 0836  vancomycin (VANCOCIN) 500 mg in sodium chloride 0.9 % 100 mL IVPB  Status:  Discontinued     500 mg 100 mL/hr over 60 Minutes Intravenous Every 12 hours 11/28/15 0836 11/28/15 0845   11/28/15 0836  aztreonam (AZACTAM) 2 g in dextrose 5 % 50 mL IVPB  Status:  Discontinued     2 g 100 mL/hr over 30 Minutes Intravenous Every 8 hours 11/28/15 0836 11/28/15 0846   11/27/15 1700  vancomycin (VANCOCIN) 500 mg in sodium chloride 0.9 % 100 mL IVPB  Status:  Discontinued     500 mg 100 mL/hr over 60 Minutes Intravenous Every 12 hours 11/27/15 1135 11/29/15 0538   11/27/15 1300  aztreonam (AZACTAM) 2 g in dextrose 5 % 50 mL IVPB     2 g 100 mL/hr over 30 Minutes Intravenous Every 8 hours 11/27/15 1135  11/27/15 0515  levofloxacin (LEVAQUIN) IVPB 750 mg     750 mg 100 mL/hr over 90 Minutes Intravenous  Once 11/27/15 0514 11/27/15 0646   11/27/15 0515  aztreonam (AZACTAM) 2 g in dextrose 5 % 50 mL IVPB     2 g 100 mL/hr over 30 Minutes Intravenous  Once 11/27/15 0514 11/27/15 0611   11/27/15 0515  vancomycin (VANCOCIN) IVPB 1000 mg/200 mL premix     1,000 mg 200 mL/hr over 60 Minutes Intravenous  Once 11/27/15 0514 11/27/15 5409      Assessment/Plan:  55 year old female with advanced MS and multiple areas of skin breakdown. He was ulcers appear to be responding to Dakin's dressings. No current plans for surgical debridement. Continue twice a day dressing changes. MRI of pelvis ordered but not obtained yet. Surgery will continue to follow.  Tayanna Talford T. Tonita Cong, MD,  FACS  11/29/2015

## 2015-11-29 NOTE — Progress Notes (Signed)
Low grade fever this shift, tachy in 120s post MRI, and RR tachypneic.  See MRI results; this RN requested a PICC line after reviewing, for long term ABX.  Pt appears to rest comfortably, wounds packed and dressed per orders, temp foley taken out for MRI, replaced w/ #14 french foley.  Very difficult insertion.  Left hand IV flushed, no blood return and senstive for patient who localizes pain, non verbal this shift.

## 2015-11-29 NOTE — Progress Notes (Signed)
[facility] [place] [facility]   PATIENT NAME: Brittany Madden    MR#:  [id number]  DATE OF BIRTH:  June 11, 1960  SUBJECTIVE:  CHIEF COMPLAINT:   Chief Complaint  Patient presents with  . 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 Patient was noted to be tachycardic in the emergency room with heart rates in the 160s and febrile. Her blood pressure was also found to be low in 90s over 60s, she was initiated on high rate IV fluids and somewhat improved. Heart rate, however, remains high at 120, blood pressure is stable in 90s. Patient is not on pressors. Patient was seen by wound care specialist and recommended surgical evaluation, discussed with Dr. Weston Brass and patient was examined together with her, wound and sacral area reaches bone, concerning for osteomyelitis. Wound culture is pending, blood cultures are negative so far, patient is on vancomycin and Aztreonam and levofloxacin. She is responsive, , however, nonverbal and not able to answer or mouth answers to questions.  MRI of pelvic bone is pending Review of Systems  Unable to perform ROS: language    VITAL SIGNS: Blood pressure 118/69, pulse 121, temperature 99.4 F (37.4 C), temperature source Other (Comment), resp. rate 29, height  (1.626 m), weight 53 kg (116 lb 13.5 oz), SpO2 99 %.  PHYSICAL EXAMINATION:   GENERAL:  55 y.o.-year-old patient lying in the bed with no acute distress. Smiles,  more alert today, nonverbal EYES: Pupils equal, round, reactive to light and accommodation. No scleral icterus. Extraocular muscles intact.  HEENT: Head atraumatic, normocephalic. Oropharynx and nasopharynx clear.  NECK:  Supple, no jugular venous distention. No  thyroid enlargement, no tenderness.  LUNGS: Normal breath sounds bilaterally, no wheezing, rales,rhonchi or crepitation. No use of accessory muscles of respiration.  CARDIOVASCULAR: S1, S2 normal. No murmurs, rubs, or gallops.  ABDOMEN: Soft, nontender, nondistended. Bowel sounds present. No organomegaly or mass. PEG tube in right upper quadrant EXTREMITIES: No pedal edema, cyanosis, or clubbing.  NEUROLOGIC: Cranial nerves II through XII are intact. Muscle strength 5/5 in all extremities. Sensation intact. Gait not checked.  PSYCHIATRIC: The patient is alert today, smiles upon me entering the room.  SKIN: No obvious rash, 2 deep ulcers 1. reaching sacral bone was noted in the back. Foley catheter is draining amber color urine  ORDERS/RESULTS REVIEWED:   CBC  Recent Labs Lab 11/27/15 0500 11/27/15 1635 11/28/15 0059 11/28/15 0744 11/28/15 1556 11/29/15 0455  WBC 22.8*  --  14.5*  --   --  9.1  HGB 11.2* 9.8* 9.7* 9.2* 8.6* 7.5*  HCT 35.3  --  31.3*  --   --  22.2*  PLT 505*  --  322  --   --  348  MCV 81.8  --  82.0  --   --  81.5  MCH 26.1  --  25.5*  --   --  27.4  MCHC 31.9*  --  31.1*  --   --  33.7  RDW 15.3*  --  15.4*  --   --  15.4*  LYMPHSABS 1.6  --   --   --   --   --   MONOABS 2.3*  --   --   --   --   --   EOSABS  0.0  --   --   --   --   --   BASOSABS 0.0  --   --   --   --   --    ------------------------------------------------------------------------------------------------------------------  Chemistries   Recent Labs Lab 11/27/15 0500 11/27/15 1857  NA 132* 137  K 3.9 4.6  CL 99* 110  CO2 24 22  GLUCOSE 106* 79  BUN 9 5*  CREATININE 0.32* <0.30*  CALCIUM 8.6* 7.9*  AST 63* 40  ALT 78* 49  ALKPHOS 179* 136*  BILITOT 0.8 1.1   ------------------------------------------------------------------------------------------------------------------ CrCl cannot be calculated (Patient has no serum creatinine result on  file.). ------------------------------------------------------------------------------------------------------------------ No results for input(s): TSH, T4TOTAL, T3FREE, THYROIDAB in the last 72 hours.  Invalid input(s): FREET3  Cardiac Enzymes  Recent Labs Lab 11/27/15 0500  TROPONINI <0.03   ------------------------------------------------------------------------------------------------------------------ Invalid input(s): POCBNP ---------------------------------------------------------------------------------------------------------------  RADIOLOGY: Mr Pelvis W Wo Contrast  11/29/2015  CLINICAL DATA:  Advanced MS, now essentially causing paralysis and contracture. Patient was having some worsening mental status and bleeding from wounds which as well as fever and chills per chart review, which brought her to the hospital. She has been admitted to the ICU with urosepsis and pressure ulcers. EXAM: MRI PELVIS WITHOUT AND WITH CONTRAST TECHNIQUE: Multiplanar multisequence MR imaging of the pelvis was performed both before and after administration of intravenous contrast. CONTRAST:  10mL MULTIHANCE GADOBENATE DIMEGLUMINE 529 MG/ML IV SOLN COMPARISON:  Bones/Joint/Cartilage Large decubitus ulcer overlying the posterior right hip. Wound extends down to the right hip showing. Right hip there is peripherally dislocated. Bone destruction of the femoral head and acetabulum. Mild bone destruction of the ischial tuberosity. Right joint effusion with severe synovitis. Right inguinal and right lower pelvic wall lymphadenopathy. No other fracture or dislocation. Normal sacrum and sacroiliac joints. Muscles Mild muscle edema within the paraspinal musculature and gluteal musculature likely reflecting an element of neurogenic edema given the patient's history. Severe muscle edema in the right gluteus minimus muscle adjacent to the right hip pathology likely reflecting infectious myositis. Soft tissue No other fluid  collection or hematoma.  No soft tissue mass. FINDINGS: 1. Large decubitus ulcer overlying the posterior right hip extending down to the right hip joint with severe cellulitis. Right hip dislocation with bone destruction of the femoral head and acetabulum as well as severe right hip synovitis most consistent with severe septic arthritis. Right gluteus minimus infectious myositis. Electronically Signed   By: Elige Ko   On: 11/29/2015 15:13    EKG:  Orders placed or performed during the hospital encounter of 11/27/15  . EKG 12-Lead  . 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 Sepsis, concerning for wound infection, possible osteomyelitis, continue antibiotic therapy with broad-spectrum medications,Including  levofloxacin, vancomycin . Blood cultures are negative so far, wound cultures are pending, few Staphylococcus aureus .Marland Kitchen Get ID consult. Awaiting for MRI of the pelvic bone to rule out osteomyelitis #2. Sacral decubitus, stage III and 4, concerning for wound infection, osteomyelitis as above, awaiting for MRI of the bone to rule out osteomyelitis, continue vancomycin and levofloxacin, discontinue Azactam #3. Hyponatremia, resolved with IV fluid administration #4. Severe malnutrition with albumin level of 1.8 with rehydration, continue tube feeds, however suspect loss of albumin  with wound drainage, the patient is unlikely to heal her wounds, and she is poor candidate for any kind of surgical procedure, discussed with surgeon, continue Dakin dressings, improved per surgery #5. Hypotension, stable with therapy #6. Sinus tachycardia, likely sepsis related, did not improve, holding Lexapro. Get Doppler ultrasound of lower extremities to rule out DVT, proceed  to CTA of chest to rule out ulnar embolism. If positive for DVT #7. Acute posthemorrhagic anemia, about 2 g drop over the past 24 hours, bleeding has subsided, transfuse patient as packed red blood cells, discussed with patient's brother, risks as well as benefits were discussed, he voiced understanding, agreeable for transfusion #8. Right hip septic arthritis, get orthopedic surgeon involved for further recommendations, continue vancomycin intravenously  Management plans discussed with the patient, family and they are in agreement.   DRUG ALLERGIES:  Allergies  Allergen Reactions  . Asa [Aspirin] Other (See Comments)    Reaction: unknown  . Keflex [Cephalexin] Other (See Comments)    Reaction: unknown   . Nsaids Other (See Comments)    Reaction: unknown   . Other Other (See Comments)    SEAFOOD  . Salicylates Other (See Comments)    Reaction: unknown    CODE STATUS:     Code Status Orders        Start     Ordered   11/27/15 1131  Do not attempt resuscitation (DNR)   Continuous    Question Answer Comment  In the event of cardiac or respiratory ARREST Do not call a "code blue"   In the event of cardiac or respiratory ARREST Do not perform Intubation, CPR, defibrillation or ACLS   In the event of cardiac or respiratory ARREST Use medication by any route, position, wound care, and other measures to relive pain and suffering. May use oxygen, suction and manual treatment of airway obstruction as needed for comfort.      11/27/15 1130    Code Status History    Date Active Date Inactive Code Status Order ID Comments User Context   11/27/2015  5:20 AM 11/27/2015 11:30 AM DNR [id number]  Loleta Rose, MD ED   01/03/2015  5:49 AM 01/09/2015  4:31 PM DNR [id number]  Elenora Fender Ward, MD ED    Advance Directive Documentation        Most Recent Value   Type of Advance Directive  Out of facility DNR (pink MOST or yellow form)   Pre-existing out of facility DNR order (yellow form or pink MOST  form)     "MOST" Form in Place?        TOTAL critical care TIME TAKING CARE OF THIS PATIENT: 45  minutes.    Katharina Caper M.D on 11/29/2015 at 5:00 PM  Between 7am to 6pm - Pager - [phone]  After 6pm go to [email or web address] - password [email or web address] [facility]  [facility]  Office  [phone]  CC: Primary care physician; Lorrin Mais, MD

## 2015-11-30 ENCOUNTER — Inpatient Hospital Stay: Payer: Medicare Other

## 2015-11-30 LAB — URINE CULTURE: CULTURE: NO GROWTH

## 2015-11-30 LAB — TYPE AND SCREEN
ABO/RH(D): O POS
ANTIBODY SCREEN: NEGATIVE
UNIT DIVISION: 0

## 2015-11-30 LAB — CBC WITH DIFFERENTIAL/PLATELET
BASOS ABS: 0.1 10*3/uL (ref 0–0.1)
BASOS PCT: 1 %
Eosinophils Absolute: 0.1 10*3/uL (ref 0–0.7)
Eosinophils Relative: 1 %
HEMATOCRIT: 29.1 % — AB (ref 35.0–47.0)
HEMOGLOBIN: 10 g/dL — AB (ref 12.0–16.0)
LYMPHS ABS: 1.8 10*3/uL (ref 1.0–3.6)
Lymphocytes Relative: 12 %
MCH: 28.1 pg (ref 26.0–34.0)
MCHC: 34.4 g/dL (ref 32.0–36.0)
MCV: 81.8 fL (ref 80.0–100.0)
MONOS PCT: 10 %
Monocytes Absolute: 1.5 10*3/uL — ABNORMAL HIGH (ref 0.2–0.9)
Neutro Abs: 11.3 10*3/uL — ABNORMAL HIGH (ref 1.4–6.5)
Neutrophils Relative %: 76 %
Platelets: 390 10*3/uL (ref 150–440)
RBC: 3.55 MIL/uL — AB (ref 3.80–5.20)
RDW: 15.5 % — ABNORMAL HIGH (ref 11.5–14.5)
WBC: 14.8 10*3/uL — ABNORMAL HIGH (ref 3.6–11.0)

## 2015-11-30 LAB — BASIC METABOLIC PANEL
Anion gap: 5 (ref 5–15)
BUN: 7 mg/dL (ref 6–20)
CALCIUM: 8.1 mg/dL — AB (ref 8.9–10.3)
CO2: 26 mmol/L (ref 22–32)
Chloride: 104 mmol/L (ref 101–111)
Creatinine, Ser: <0.3 mg/dL — ABNORMAL LOW (ref 0.44–1.00)
GLUCOSE: 89 mg/dL (ref 65–99)
POTASSIUM: 3.3 mmol/L — AB (ref 3.5–5.1)
Sodium: 135 mmol/L (ref 135–145)

## 2015-11-30 LAB — GLUCOSE, CAPILLARY
Glucose-Capillary: 119 mg/dL — ABNORMAL HIGH (ref 65–99)
Glucose-Capillary: 126 mg/dL — ABNORMAL HIGH (ref 65–99)
Glucose-Capillary: 138 mg/dL — ABNORMAL HIGH (ref 65–99)
Glucose-Capillary: 96 mg/dL (ref 65–99)
Glucose-Capillary: 97 mg/dL (ref 65–99)

## 2015-11-30 LAB — AEROBIC CULTURE  (SUPERFICIAL SPECIMEN)

## 2015-11-30 LAB — AEROBIC CULTURE W GRAM STAIN (SUPERFICIAL SPECIMEN)

## 2015-11-30 LAB — VANCOMYCIN, TROUGH: Vancomycin Tr: 13 ug/mL — ABNORMAL LOW (ref 15–20)

## 2015-11-30 MED ORDER — POTASSIUM CHLORIDE 20 MEQ PO PACK
20.0000 meq | PACK | Freq: Once | ORAL | Status: AC
  Administered 2015-11-30: 20 meq via ORAL
  Filled 2015-11-30: qty 1

## 2015-11-30 MED ORDER — FAMOTIDINE 20 MG PO TABS
20.0000 mg | ORAL_TABLET | Freq: Two times a day (BID) | ORAL | Status: DC
  Administered 2015-11-30 – 2015-12-02 (×5): 20 mg via ORAL
  Filled 2015-11-30 (×5): qty 1

## 2015-11-30 MED ORDER — SODIUM CHLORIDE 0.9% FLUSH: 10.0000 mL | INTRAVENOUS | Status: DC | PRN

## 2015-11-30 MED ORDER — SODIUM CHLORIDE 0.9% FLUSH
10.0000 mL | Freq: Two times a day (BID) | INTRAVENOUS | Status: DC
  Administered 2015-11-30: 30 mL
  Administered 2015-11-30 – 2015-12-01 (×3): 10 mL

## 2015-11-30 NOTE — Consult Note (Signed)
ORTHOPAEDIC CONSULTATION  REQUESTING PHYSICIAN: Gracelyn Nurse, MD  Chief Complaint: Right hip deformity  HPI: Brittany Madden is a 55 y.o. female who complains of  Right hip deformity.  She has advanced MS and is non verbal.  Admitted for severe bleeding from decubiti behind right hip.  Has had chronic flexion/adduction deformity of right hip according to her uncle and cousin.  MRI shows subluxation of hip and possible infection.  On antibiotics for infection at this time.    Past Medical History  Diagnosis Date  . MS (multiple sclerosis) (HCC)   . Depression   . Functional quadriplegia (HCC)   . Neurogenic bladder   . PE (pulmonary embolism) 2012  . DVT (deep venous thrombosis) (HCC) 2012   Past Surgical History  Procedure Laterality Date  . Tonsillectomy    . Laparoscopic tubal ligation    . Examination under anesthesia Right 01/03/2015    Procedure: EXAM UNDER ANESTHESIA, repair of vaginal laceration ;  Surgeon: Elenora Fender Ward, MD;  Location: [facility] ORS;  Service: Gynecology;  Laterality: Right;   Social History   Social History  . Marital Status: Divorced    Spouse Name: N/A  . Number of Children: N/A  . Years of Education: N/A   Social History Main Topics  . Smoking status: Former Games developer  . Smokeless tobacco: Never Used     Comment:     . Alcohol Use: No  . Drug Use: No  . Sexual Activity: Not Asked   Other Topics Concern  . None   Social History Narrative   No family history on file. Allergies  Allergen Reactions  . Asa [Aspirin] Other (See Comments)    Reaction: unknown  . Keflex [Cephalexin] Other (See Comments)    Reaction: unknown   . Nsaids Other (See Comments)    Reaction: unknown   . Other Other (See Comments)    SEAFOOD  . Salicylates Other (See Comments)    Reaction: unknown   Prior to Admission medications   Medication Sig Start Date End Date Taking? Authorizing Provider  antiseptic oral rinse (BIOTENE) LIQD 15 mLs by Mouth Rinse route  every 2 (two) hours as needed for dry mouth.   Yes Historical Provider, MD  baclofen (LIORESAL) 20 MG tablet 20 mg by Feeding Tube route every 6 (six) hours.   Yes Historical Provider, MD  carboxymethylcellulose (REFRESH TEARS) 0.5 % SOLN Place 1 drop into both eyes 2 (two) times daily.   Yes Historical Provider, MD  clopidogrel (PLAVIX) 75 MG tablet Take 75 mg by mouth daily.   Yes Historical Provider, MD  coal tar (NEUTROGENA T-GEL) 0.5 % shampoo Apply 1 application topically 2 (two) times a week.   Yes Historical Provider, MD  Cranberry-Vitamin C-Inulin (UTI-STAT PO) Take 30 mLs by mouth daily.   Yes Historical Provider, MD  ergocalciferol (DRISDOL) 8000 UNIT/ML drops Take 50,000 Units by mouth every 30 (thirty) days.   Yes Historical Provider, MD  escitalopram (LEXAPRO) 5 MG/5ML solution 10 mg by Feeding Tube route daily.   Yes Historical Provider, MD  gabapentin (NEURONTIN) 250 MG/5ML solution Place 600 mg into feeding tube 3 (three) times daily.   Yes Historical Provider, MD  metoprolol tartrate (LOPRESSOR) 25 MG tablet Take 12.5 mg by mouth 2 (two) times daily.   Yes Historical Provider, MD  mineral oil-hydrophilic petrolatum (AQUAPHOR) ointment Apply 1 application topically daily. To feet and legs.   Yes Historical Provider, MD  Nutritional Supplements (TWOCAL HN) LIQD Take 237 mLs  by mouth daily.   Yes Historical Provider, MD  potassium chloride SA (K-DUR,KLOR-CON) 20 MEQ tablet 20 mEq by Feeding Tube route daily. Dissolve in water then give via g-tube.   Yes Historical Provider, MD  ranitidine (ZANTAC) 75 MG tablet Take 75 mg by mouth at bedtime.   Yes Historical Provider, MD   Mr Pelvis W Wo Contrast  11/29/2015  CLINICAL DATA:  Advanced MS, now essentially causing paralysis and contracture. Patient was having some worsening mental status and bleeding from wounds which as well as fever and chills per chart review, which brought her to the hospital. She has been admitted to the ICU with  urosepsis and pressure ulcers. EXAM: MRI PELVIS WITHOUT AND WITH CONTRAST TECHNIQUE: Multiplanar multisequence MR imaging of the pelvis was performed both before and after administration of intravenous contrast. CONTRAST:  10mL MULTIHANCE GADOBENATE DIMEGLUMINE 529 MG/ML IV SOLN COMPARISON:  Bones/Joint/Cartilage Large decubitus ulcer overlying the posterior right hip. Wound extends down to the right hip showing. Right hip there is peripherally dislocated. Bone destruction of the femoral head and acetabulum. Mild bone destruction of the ischial tuberosity. Right joint effusion with severe synovitis. Right inguinal and right lower pelvic wall lymphadenopathy. No other fracture or dislocation. Normal sacrum and sacroiliac joints. Muscles Mild muscle edema within the paraspinal musculature and gluteal musculature likely reflecting an element of neurogenic edema given the patient's history. Severe muscle edema in the right gluteus minimus muscle adjacent to the right hip pathology likely reflecting infectious myositis. Soft tissue No other fluid collection or hematoma.  No soft tissue mass. FINDINGS: 1. Large decubitus ulcer overlying the posterior right hip extending down to the right hip joint with severe cellulitis. Right hip dislocation with bone destruction of the femoral head and acetabulum as well as severe right hip synovitis most consistent with severe septic arthritis. Right gluteus minimus infectious myositis. Electronically Signed   By: Elige Ko   On: 11/29/2015 15:13   Dg Pelvis Portable  11/30/2015  CLINICAL DATA:  Chronic right knee pain.  MS. EXAM: PORTABLE PELVIS 1-2 VIEWS COMPARISON:  MRI 11/29/2015. FINDINGS: Deformity right hip. The right femur is difficult to evaluate due to the patient's MS.Soft tissue swelling noted about the right gluteal region. Again cellulitis could present in this fashion. Soft tissue air adjacent to the right hip cannot be excluded. This may be related to infection  and/or ulceration . Bony erosion of the right femoral head cannot be excluded. Reference made MRI report. IVC filter noted. IMPRESSION: Deformity right hip. The right femur is difficult to evaluate due to the patient's MS. Soft tissue swelling noted about the right gluteal region. Cellulitis could present in this fashion . Soft tissue air adjacent to the right hip cannot be excluded. This may be related to infection or and/or ulceration. Bony erosion of the right femoral head cannot be excluded. Reference is made to MRI report. Electronically Signed   By: Maisie Fus  Register   On: 11/30/2015 12:01   Dg Chest Port 1 View  11/29/2015  CLINICAL DATA:  Right-sided PICC line placement, right arm is contorted. EXAM: PORTABLE CHEST 1 VIEW COMPARISON:  Chest x-ray dated 11/27/2015. FINDINGS: Right-sided PICC line appears well positioned with tip at the level of the mid SVC. Lungs are clear. No pleural effusion or pneumothorax seen. Cardiomediastinal silhouette is stable. IMPRESSION: Presumed right-sided PICC line appears adequately positioned with tip at the level of the mid SVC. Electronically Signed   By: Bary Richard M.D.   On: 11/29/2015  19:10    Positive ROS: All other systems have been reviewed and were otherwise negative with the exception of those mentioned in the HPI and as above.  Physical Exam: General: Alert, no acute distress Cardiovascular: No pedal edema Respiratory: No cyanosis, no use of accessory musculature GI: No organomegaly, abdomen is soft and non-tender Skin: No lesions in the area of chief complaint Neurologic: Sensation intact distally Psychiatric: Patient is competent for consent with normal mood and affect Lymphatic: No axillary or cervical lymphadenopathy  MUSCULOSKELETAL: Patient awake but non verbal.  Right hip flexed and adducted.  Very limited motion possible which appears painful with patient grimacing.  Two large decubiti posteriorly draining serosanguinous fluids.  No  surrounding cellulitis or increase in drainage with compression of hip.    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  Plan: Continue antibiotics as needed. I do not think any surgery is indicated for her hip at this time.  She is a very poor candidate for any surgery and a Girdlestone procedure would be of minimal benefit.      Valinda Hoar, MD [phone]   11/30/2015 12:48 PM

## 2015-11-30 NOTE — Progress Notes (Signed)
Subjective: Admitted with sepsis. Patient non verbal  Objective: Vital signs in last 24 hours: Temp:  [98.3 F (36.8 C)-98.9 F (37.2 C)] 98.9 F (37.2 C) (06/27 1200) Pulse Rate:  [94-113] 105 (06/27 1500) Resp:  [22-33] 30 (06/27 1500) BP: (91-128)/(63-88) 106/64 mmHg (06/27 1500) SpO2:  [97 %-100 %] 99 % (06/27 1500) Weight:  [53 kg (116 lb 13.5 oz)] 53 kg (116 lb 13.5 oz) (06/27 0519) Weight change: -0.4 kg (-14.1 oz) Last BM Date: 11/30/15  Intake/Output from previous day: 06/26 0701 - 06/27 0700 In: 2410 [Blood:330; NG/GT:1140; IV Piggyback:750] Out: 1125 [Urine:1125] Intake/Output this shift: Total I/O In: 880 [Other:100; NG/GT:480; IV Piggyback:300] Out: 700 [Urine:700]  General appearance: toxic Head: Normocephalic, without obvious abnormality, atraumatic Eyes: conjunctivae/corneas clear. PERRL, EOM's intact. Fundi benign. Resp: clear to auscultation bilaterally and normal percussion bilaterally Cardio: regular rate and rhythm, S1, S2 normal, no murmur, click, rub or gallop GI: soft, non-tender; bowel sounds normal; no masses,  no organomegaly Extremities: Ulcers and una boots on both feet. Large stage 4 sacral decubitis ulcer with drainage. Neurologic: Motor: contracted Reflexes: 2+ and symmetric deminished  Lab Results:  Recent Labs  11/29/15 0455 11/29/15 1907 11/30/15 0507  WBC 9.1  --  14.8*  HGB 7.5* 7.7* 10.0*  HCT 22.2* 23.6* 29.1*  PLT 348  --  390   BMET  Recent Labs  11/27/15 1857 11/30/15 0507  NA 137 135  K 4.6 3.3*  CL 110 104  CO2 22 26  GLUCOSE 79 89  BUN 5* 7  CREATININE <0.30* <0.30*  CALCIUM 7.9* 8.1*    Studies/Results: Mr Pelvis W Wo Contrast  11/29/2015  CLINICAL DATA:  Advanced MS, now essentially causing paralysis and contracture. Patient was having some worsening mental status and bleeding from wounds which as well as fever and chills per chart review, which brought her to the hospital. She has been admitted to the  ICU with urosepsis and pressure ulcers. EXAM: MRI PELVIS WITHOUT AND WITH CONTRAST TECHNIQUE: Multiplanar multisequence MR imaging of the pelvis was performed both before and after administration of intravenous contrast. CONTRAST:  78mL MULTIHANCE GADOBENATE DIMEGLUMINE 529 MG/ML IV SOLN COMPARISON:  Bones/Joint/Cartilage Large decubitus ulcer overlying the posterior right hip. Wound extends down to the right hip showing. Right hip there is peripherally dislocated. Bone destruction of the femoral head and acetabulum. Mild bone destruction of the ischial tuberosity. Right joint effusion with severe synovitis. Right inguinal and right lower pelvic wall lymphadenopathy. No other fracture or dislocation. Normal sacrum and sacroiliac joints. Muscles Mild muscle edema within the paraspinal musculature and gluteal musculature likely reflecting an element of neurogenic edema given the patient's history. Severe muscle edema in the right gluteus minimus muscle adjacent to the right hip pathology likely reflecting infectious myositis. Soft tissue No other fluid collection or hematoma.  No soft tissue mass. FINDINGS: 1. Large decubitus ulcer overlying the posterior right hip extending down to the right hip joint with severe cellulitis. Right hip dislocation with bone destruction of the femoral head and acetabulum as well as severe right hip synovitis most consistent with severe septic arthritis. Right gluteus minimus infectious myositis. Electronically Signed   By: Elige Ko   On: 11/29/2015 15:13   Dg Pelvis Portable  11/30/2015  CLINICAL DATA:  Chronic right knee pain.  MS. EXAM: PORTABLE PELVIS 1-2 VIEWS COMPARISON:  MRI 11/29/2015. FINDINGS: Deformity right hip. The right femur is difficult to evaluate due to the patient's MS.Soft tissue swelling noted about the right  gluteal region. Again cellulitis could present in this fashion. Soft tissue air adjacent to the right hip cannot be excluded. This may be related to  infection and/or ulceration . Bony erosion of the right femoral head cannot be excluded. Reference made MRI report. IVC filter noted. IMPRESSION: Deformity right hip. The right femur is difficult to evaluate due to the patient's MS. Soft tissue swelling noted about the right gluteal region. Cellulitis could present in this fashion . Soft tissue air adjacent to the right hip cannot be excluded. This may be related to infection or and/or ulceration. Bony erosion of the right femoral head cannot be excluded. Reference is made to MRI report. Electronically Signed   By: Maisie Fus  Register   On: 11/30/2015 12:01   Dg Chest Port 1 View  11/29/2015  CLINICAL DATA:  Right-sided PICC line placement, right arm is contorted. EXAM: PORTABLE CHEST 1 VIEW COMPARISON:  Chest x-ray dated 11/27/2015. FINDINGS: Right-sided PICC line appears well positioned with tip at the level of the mid SVC. Lungs are clear. No pleural effusion or pneumothorax seen. Cardiomediastinal silhouette is stable. IMPRESSION: Presumed right-sided PICC line appears adequately positioned with tip at the level of the mid SVC. Electronically Signed   By: Bary Richard M.D.   On: 11/29/2015 19:10    Medications:  I have reviewed the patient's current medications. Scheduled: . sodium chloride   Intravenous Once  . antiseptic oral rinse  7 mL Mouth Rinse q12n4p  . chlorhexidine  15 mL Mouth Rinse BID  . collagenase   Topical Daily  . famotidine  20 mg Oral BID  . feeding supplement (PRO-STAT SUGAR FREE 64)  30 mL Per Tube Daily  . gabapentin  600 mg Per Tube TID  . levofloxacin (LEVAQUIN) IV  750 mg Intravenous Q24H  . metoprolol tartrate  12.5 mg Oral Q6H  . polyvinyl alcohol  1 drop Both Eyes BID  . potassium chloride  20 mEq Oral Once  . sodium chloride flush  10-40 mL Intracatheter Q12H  . sodium chloride flush  3 mL Intravenous Q12H  . vancomycin  750 mg Intravenous Q8H   Continuous: . feeding supplement (JEVITY 1.5 CAL/FIBER) 1,000 mL  (11/29/15 1108)  . 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 Sepsis, Secondary to wound infection from sacral decubitis. #2. Sacral decubitus, stage III and 4, concerning for wound infection, MRI of the hip shows potential septic arthritis but no osteomyelitis. Ortho has evaluated and rec continueing abx since not a surgical candidate. continue vancomycin and levofloxacin, discontinue Azactam #3. Hyponatremia, resolved with IV fluid administration #4. Severe malnutrition with albumin level of 1.8 with rehydration, continue tube feeds, however suspect loss of albumin with wound drainage, the patient is unlikely to heal her wounds, and she is poor candidate for any kind of surgical procedure, discussed with surgeon, continue Dakin dressings, improved per surgery #5. Hypotension, resolved with IVF. #6. Sinus tachycardia, likely sepsis related, did not improve, holding Lexapro. Improved with mild increase in metoprolol. #7. Acute posthemorrhagic anemia, about 2 g drop over the past 24 hours, bleeding has subsided, transfuse patient as packed red blood cells, discussed with patient's brother, risks as well as benefits were discussed, he voiced understanding, agreeable for transfusion #8. Right hip septic arthritis,  orthopedic surgeon evaluated as  above.Continue abx. No surgery for now. #9. UTI: On abx. Cultures pending.  Time= 35 min   LOS: 3 days   Gracelyn Nurse 11/30/2015, 4:21 PM

## 2015-11-30 NOTE — Progress Notes (Signed)
CONCERNING: IV to Oral Route Change Policy  RECOMMENDATION: This patient is receiving famotidine by the intravenous route.  Based on criteria approved by the Pharmacy and Therapeutics Committee, the intravenous medication(s) is/are being converted to the equivalent oral dose form(s).   DESCRIPTION: These criteria include:  The patient is eating (either orally or via tube) and/or has been taking other orally administered medications for a least 24 hours  The patient has no evidence of active gastrointestinal bleeding or impaired GI absorption (gastrectomy, short bowel, patient on TNA or NPO).  If you have questions about this conversion, please contact the Pharmacy Department    [phone] )  [facility]   [phone] )  [facility]   [phone] )  [facility]   [phone] )  [facility]   [phone] )  [facility]   Brittany Madden, [facility] 11/30/2015 9:42 AM

## 2015-11-30 NOTE — Progress Notes (Signed)
CC: Sepsis Subjective: Patient is nonverbal. Surgery consulted for decubitus ulcers to the right hip  Objective: Vital signs in last 24 hours: Temp:  [98.3 F (36.8 C)-98.9 F (37.2 C)] 98.3 F (36.8 C) (06/27 1600) Pulse Rate:  [94-113] 113 (06/27 1700) Resp:  [22-33] 32 (06/27 1700) BP: (91-128)/(63-88) 109/63 mmHg (06/27 1700) SpO2:  [97 %-100 %] 100 % (06/27 1700) Weight:  [53 kg (116 lb 13.5 oz)] 53 kg (116 lb 13.5 oz) (06/27 0519) Last BM Date: 11/30/15  Intake/Output from previous day: 06/26 0701 - 06/27 0700 In: 2410 [Blood:330; NG/GT:1140; IV Piggyback:750] Out: 1125 [Urine:1125] Intake/Output this shift: Total I/O In: 1000 [Other:100; NG/GT:600; IV Piggyback:300] Out: 850 [Urine:850]  Physical exam:  General: Resting bed in no acute distress Skin: Right Sacral decubitus stage III clean and dry with no obvious necrotic material Right ischial wound: Minimal deep necrosis in wound bed, tender to palpation, no spreading erythema but some undermining laterally, ischium palpable and visible through wound bed   Lab Results: CBC   Recent Labs  11/29/15 0455 11/29/15 1907 11/30/15 0507  WBC 9.1  --  14.8*  HGB 7.5* 7.7* 10.0*  HCT 22.2* 23.6* 29.1*  PLT 348  --  390   BMET  Recent Labs  11/27/15 1857 11/30/15 0507  NA 137 135  K 4.6 3.3*  CL 110 104  CO2 22 26  GLUCOSE 79 89  BUN 5* 7  CREATININE <0.30* <0.30*  CALCIUM 7.9* 8.1*   PT/INR No results for input(s): LABPROT, INR in the last 72 hours. ABG No results for input(s): PHART, HCO3 in the last 72 hours.  Invalid input(s): PCO2, PO2  Studies/Results: Mr Pelvis W Wo Contrast  11/29/2015  CLINICAL DATA:  Advanced MS, now essentially causing paralysis and contracture. Patient was having some worsening mental status and bleeding from wounds which as well as fever and chills per chart review, which brought her to the hospital. She has been admitted to the ICU with urosepsis and pressure ulcers.  EXAM: MRI PELVIS WITHOUT AND WITH CONTRAST TECHNIQUE: Multiplanar multisequence MR imaging of the pelvis was performed both before and after administration of intravenous contrast. CONTRAST:  10mL MULTIHANCE GADOBENATE DIMEGLUMINE 529 MG/ML IV SOLN COMPARISON:  Bones/Joint/Cartilage Large decubitus ulcer overlying the posterior right hip. Wound extends down to the right hip showing. Right hip there is peripherally dislocated. Bone destruction of the femoral head and acetabulum. Mild bone destruction of the ischial tuberosity. Right joint effusion with severe synovitis. Right inguinal and right lower pelvic wall lymphadenopathy. No other fracture or dislocation. Normal sacrum and sacroiliac joints. Muscles Mild muscle edema within the paraspinal musculature and gluteal musculature likely reflecting an element of neurogenic edema given the patient's history. Severe muscle edema in the right gluteus minimus muscle adjacent to the right hip pathology likely reflecting infectious myositis. Soft tissue No other fluid collection or hematoma.  No soft tissue mass. FINDINGS: 1. Large decubitus ulcer overlying the posterior right hip extending down to the right hip joint with severe cellulitis. Right hip dislocation with bone destruction of the femoral head and acetabulum as well as severe right hip synovitis most consistent with severe septic arthritis. Right gluteus minimus infectious myositis. Electronically Signed   By: Elige Ko   On: 11/29/2015 15:13   Dg Pelvis Portable  11/30/2015  CLINICAL DATA:  Chronic right knee pain.  MS. EXAM: PORTABLE PELVIS 1-2 VIEWS COMPARISON:  MRI 11/29/2015. FINDINGS: Deformity right hip. The right femur is difficult to evaluate due to  the patient's MS.Soft tissue swelling noted about the right gluteal region. Again cellulitis could present in this fashion. Soft tissue air adjacent to the right hip cannot be excluded. This may be related to infection and/or ulceration . Bony erosion  of the right femoral head cannot be excluded. Reference made MRI report. IVC filter noted. IMPRESSION: Deformity right hip. The right femur is difficult to evaluate due to the patient's MS. Soft tissue swelling noted about the right gluteal region. Cellulitis could present in this fashion . Soft tissue air adjacent to the right hip cannot be excluded. This may be related to infection or and/or ulceration. Bony erosion of the right femoral head cannot be excluded. Reference is made to MRI report. Electronically Signed   By: Maisie Fus  Register   On: 11/30/2015 12:01   Dg Chest Port 1 View  11/29/2015  CLINICAL DATA:  Right-sided PICC line placement, right arm is contorted. EXAM: PORTABLE CHEST 1 VIEW COMPARISON:  Chest x-ray dated 11/27/2015. FINDINGS: Right-sided PICC line appears well positioned with tip at the level of the mid SVC. Lungs are clear. No pleural effusion or pneumothorax seen. Cardiomediastinal silhouette is stable. IMPRESSION: Presumed right-sided PICC line appears adequately positioned with tip at the level of the mid SVC. Electronically Signed   By: Bary Richard M.D.   On: 11/29/2015 19:10    Anti-infectives: Anti-infectives    Start     Dose/Rate Route Frequency Ordered Stop   11/29/15 0600  vancomycin (VANCOCIN) IVPB 750 mg/150 ml premix     750 mg 150 mL/hr over 60 Minutes Intravenous Every 8 hours 11/29/15 0538     11/28/15 1000  levofloxacin (LEVAQUIN) IVPB 750 mg     750 mg 100 mL/hr over 90 Minutes Intravenous Every 24 hours 11/27/15 1135     11/28/15 0845  levofloxacin (LEVAQUIN) IVPB 750 mg  Status:  Discontinued     750 mg 100 mL/hr over 90 Minutes Intravenous Every 24 hours 11/28/15 0836 11/28/15 0846   11/28/15 0836  vancomycin (VANCOCIN) 500 mg in sodium chloride 0.9 % 100 mL IVPB  Status:  Discontinued     500 mg 100 mL/hr over 60 Minutes Intravenous Every 12 hours 11/28/15 0836 11/28/15 0845   11/28/15 0836  aztreonam (AZACTAM) 2 g in dextrose 5 % 50 mL IVPB   Status:  Discontinued     2 g 100 mL/hr over 30 Minutes Intravenous Every 8 hours 11/28/15 0836 11/28/15 0846   11/27/15 1700  vancomycin (VANCOCIN) 500 mg in sodium chloride 0.9 % 100 mL IVPB  Status:  Discontinued     500 mg 100 mL/hr over 60 Minutes Intravenous Every 12 hours 11/27/15 1135 11/29/15 0538   11/27/15 1300  aztreonam (AZACTAM) 2 g in dextrose 5 % 50 mL IVPB  Status:  Discontinued     2 g 100 mL/hr over 30 Minutes Intravenous Every 8 hours 11/27/15 1135 11/29/15 1518   11/27/15 0515  levofloxacin (LEVAQUIN) IVPB 750 mg     750 mg 100 mL/hr over 90 Minutes Intravenous  Once 11/27/15 0514 11/27/15 0646   11/27/15 0515  aztreonam (AZACTAM) 2 g in dextrose 5 % 50 mL IVPB     2 g 100 mL/hr over 30 Minutes Intravenous  Once 11/27/15 0514 11/27/15 0611   11/27/15 0515  vancomycin (VANCOCIN) IVPB 1000 mg/200 mL premix     1,000 mg 200 mL/hr over 60 Minutes Intravenous  Once 11/27/15 0514 11/27/15 1610      Assessment/Plan:  55 year old female  with advanced MS and multiple areas of decubitus ulcer. Ulcers appear to be responding to Dakin's dressings. MRI shows evidence of likely septic arthritis. No current plans for surgical intervention. Patient is a very poor surgical candidate. Recommend continuing antibiotics and local wound care. Please reconsult general surgery if any further surgical recommendations are needed.  Minaal Struckman T. Tonita Cong, MD, FACS  11/30/2015

## 2015-11-30 NOTE — Plan of Care (Signed)
Problem: Physical Regulation: Goal: Ability to maintain clinical measurements within normal limits will improve Outcome: Progressing Blood pressure stable, patient in sinus tachycardia to sinus rhythm. Patient tachypneic, MDs aware, no interventions at this time, O2 sats 98-100% on room air, despite tachynpenia not elevated work of breathing.

## 2015-11-30 NOTE — Progress Notes (Signed)
Notified MD, Dr. Letitia Libra, over the phone about patient's potassium level. MD acknowledged, no new orders at this time, MD states he will address when he rounds in person on the patient.

## 2015-12-01 LAB — GLUCOSE, CAPILLARY
GLUCOSE-CAPILLARY: 91 mg/dL (ref 65–99)
GLUCOSE-CAPILLARY: 91 mg/dL (ref 65–99)
Glucose-Capillary: 83 mg/dL (ref 65–99)
Glucose-Capillary: 117 mg/dL — ABNORMAL HIGH (ref 65–99)

## 2015-12-01 LAB — MAGNESIUM: Magnesium: 2.1 mg/dL (ref 1.7–2.4)

## 2015-12-01 LAB — BASIC METABOLIC PANEL
ANION GAP: 6 (ref 5–15)
BUN: 8 mg/dL (ref 6–20)
CALCIUM: 7.8 mg/dL — AB (ref 8.9–10.3)
CO2: 28 mmol/L (ref 22–32)
Chloride: 102 mmol/L (ref 101–111)
GLUCOSE: 101 mg/dL — AB (ref 65–99)
Potassium: 3.1 mmol/L — ABNORMAL LOW (ref 3.5–5.1)
Sodium: 136 mmol/L (ref 135–145)

## 2015-12-01 MED ORDER — VANCOMYCIN HCL IN DEXTROSE 1-5 GM/200ML-% IV SOLN
1000.0000 mg | Freq: Three times a day (TID) | INTRAVENOUS | Status: DC
  Administered 2015-12-01 – 2015-12-02 (×2): 1000 mg via INTRAVENOUS
  Filled 2015-12-01 (×7): qty 200

## 2015-12-01 MED ORDER — POTASSIUM CHLORIDE CRYS ER 20 MEQ PO TBCR
20.0000 meq | EXTENDED_RELEASE_TABLET | Freq: Every day | ORAL | Status: AC
  Administered 2015-12-01 – 2015-12-02 (×2): 20 meq via ORAL
  Filled 2015-12-01 (×2): qty 1

## 2015-12-01 NOTE — Progress Notes (Signed)
Report called to Darnelle Maffucci, RN.  Pt transported to room 156 via bed by this RN and NT Jessica.

## 2015-12-01 NOTE — Progress Notes (Signed)
Patient could not communicate at this time. Prayer for patient offered.

## 2015-12-01 NOTE — Progress Notes (Signed)
New Transfer Note:   Arrival Method: per bed from ICU with NT Jessica Mental Orientation: responds to voice, non verbal Telemetry: none ordered Skin: warm, dry with stage 3 ulcer on the right hip, covered with dressing, with deep tissue injury on both heels covered with foam dressing and Prevalon Boots on. IV: G20 on the right hand patent and intact Tubes: 3-lumen central line on the right IJ with transparent dressing, PEG tube on the LUQ to a continuous Jevity 1.5 feeding at 60/hr, with foleys in place to urine bag. Safety Measures: Safety Fall Prevention Plan has been implemented Family: no family member at bedside  Bed alarm has been activated. Will continue to monitor pt.  Janice Norrie BSN, RN [facility]

## 2015-12-01 NOTE — Progress Notes (Signed)
Pharmacy Antibiotic Note  Brittany Madden is a 55 y.o. female admitted on 11/27/2015 with sepsis.  Pharmacy has been consulted for levofloxacin and vancomycin dosing. Wound culture showing MRSA.   Plan: Vancomycin trough 13 mcg/mL. Adjusted Ke approximately 0.120 hr-1 and t1/2 5.8 hours. Changed dose from 750 mg IV Q8H, to vancomycin 1gm IV every 8 hours. Predicted trough 17.4 mcg/mL.  Pharmacy will continue to monitor and adjust as needed to maintain trough 15 to 20 mcg/mL.  Height:  (162.6 cm) Weight: 119 lb (53.978 kg) IBW/kg (Calculated) : 54.7  Temp (24hrs), Avg:98.9 F (37.2 C), Min:97.8 F (36.6 C), Max:101.1 F (38.4 C)   Recent Labs Lab 11/27/15 0500 11/27/15 1857 11/28/15 0059 11/29/15 0455 11/30/15 0507 11/30/15 2137 12/01/15 0450  WBC 22.8*  --  14.5* 9.1 14.8*  --   --   CREATININE 0.32* <0.30*  --   --  <0.30*  --  <0.30*  LATICACIDVEN 0.9  --   --   --   --   --   --   VANCOTROUGH  --   --   --  4*  --  13*  --     CrCl cannot be calculated (Patient has no serum creatinine result on file.).    Allergies  Allergen Reactions  . Asa [Aspirin] Other (See Comments)    Reaction: unknown  . Keflex [Cephalexin] Other (See Comments)    Reaction: unknown   . Nsaids Other (See Comments)    Reaction: unknown   . Other Other (See Comments)    SEAFOOD  . Salicylates Other (See Comments)    Reaction: unknown    Thank you for allowing pharmacy to be a part of this patient's care.  Cher Nakai, PharmD Clinical Pharmacist 12/01/2015 11:55 AM

## 2015-12-01 NOTE — Progress Notes (Signed)
Subjective: Patient non verbal No acute events Seen yesterday by surgery and orthopedic service.  Objective: Vital signs in last 24 hours: Temp:  [97.8 F (36.6 C)-101.1 F (38.4 C)] 98.8 F (37.1 C) (06/28 0745) Pulse Rate:  [100-117] 107 (06/28 0745) Resp:  [18-40] 18 (06/28 0745) BP: (89-116)/(56-71) 105/67 mmHg (06/28 0745) SpO2:  [95 %-100 %] 100 % (06/28 0745) Weight:  [53.978 kg (119 lb)] 53.978 kg (119 lb) (06/28 0500) Weight change: 0.978 kg (2 lb 2.5 oz) Last BM Date: 11/30/15  Intake/Output from previous day: 06/27 0701 - 06/28 0700 In: 2550 [NG/GT:1740; IV Piggyback:600] Out: 1275 [Urine:1275] Intake/Output this shift: Total I/O In: -  Out: 250 [Urine:250]  General appearance: Chronically ill Head: Normocephalic, without obvious abnormality, atraumatic Eyes: conjunctivae/corneas clear.  Resp: clear to auscultation bilaterally and normal percussion bilaterally Cardio: regular rate and rhythm, S1, S2 normal, no murmur, click, rub or gallop GI: soft, non-tender; bowel sounds normal; no masses, no organomegaly Extremities: Ulcers and una boots on both feet. Large stage 4 sacral decubitis ulcer with drainage. Neurologic:  contracted   Lab Results:  Recent Labs  11/29/15 0455 11/29/15 1907 11/30/15 0507  WBC 9.1  --  14.8*  HGB 7.5* 7.7* 10.0*  HCT 22.2* 23.6* 29.1*  PLT 348  --  390   BMET  Recent Labs  11/30/15 0507 12/01/15 0450  NA 135 136  K 3.3* 3.1*  CL 104 102  CO2 26 28  GLUCOSE 89 101*  BUN 7 8  CREATININE <0.30* <0.30*  CALCIUM 8.1* 7.8*    Studies/Results: Mr Pelvis W Wo Contrast  11/29/2015  CLINICAL DATA:  Advanced MS, now essentially causing paralysis and contracture. Patient was having some worsening mental status and bleeding from wounds which as well as fever and chills per chart review, which brought her to the hospital. She has been admitted to the ICU with urosepsis and pressure ulcers. EXAM: MRI PELVIS WITHOUT AND WITH  CONTRAST TECHNIQUE: Multiplanar multisequence MR imaging of the pelvis was performed both before and after administration of intravenous contrast. CONTRAST:  10mL MULTIHANCE GADOBENATE DIMEGLUMINE 529 MG/ML IV SOLN COMPARISON:  Bones/Joint/Cartilage Large decubitus ulcer overlying the posterior right hip. Wound extends down to the right hip showing. Right hip there is peripherally dislocated. Bone destruction of the femoral head and acetabulum. Mild bone destruction of the ischial tuberosity. Right joint effusion with severe synovitis. Right inguinal and right lower pelvic wall lymphadenopathy. No other fracture or dislocation. Normal sacrum and sacroiliac joints. Muscles Mild muscle edema within the paraspinal musculature and gluteal musculature likely reflecting an element of neurogenic edema given the patient's history. Severe muscle edema in the right gluteus minimus muscle adjacent to the right hip pathology likely reflecting infectious myositis. Soft tissue No other fluid collection or hematoma.  No soft tissue mass. FINDINGS: 1. Large decubitus ulcer overlying the posterior right hip extending down to the right hip joint with severe cellulitis. Right hip dislocation with bone destruction of the femoral head and acetabulum as well as severe right hip synovitis most consistent with severe septic arthritis. Right gluteus minimus infectious myositis. Electronically Signed   By: Elige Ko   On: 11/29/2015 15:13   Dg Pelvis Portable  11/30/2015  CLINICAL DATA:  Chronic right knee pain.  MS. EXAM: PORTABLE PELVIS 1-2 VIEWS COMPARISON:  MRI 11/29/2015. FINDINGS: Deformity right hip. The right femur is difficult to evaluate due to the patient's MS.Soft tissue swelling noted about the right gluteal region. Again cellulitis could present in  this fashion. Soft tissue air adjacent to the right hip cannot be excluded. This may be related to infection and/or ulceration . Bony erosion of the right femoral head cannot  be excluded. Reference made MRI report. IVC filter noted. IMPRESSION: Deformity right hip. The right femur is difficult to evaluate due to the patient's MS. Soft tissue swelling noted about the right gluteal region. Cellulitis could present in this fashion . Soft tissue air adjacent to the right hip cannot be excluded. This may be related to infection or and/or ulceration. Bony erosion of the right femoral head cannot be excluded. Reference is made to MRI report. Electronically Signed   By: Maisie Fus  Register   On: 11/30/2015 12:01   Dg Chest Port 1 View  11/29/2015  CLINICAL DATA:  Right-sided PICC line placement, right arm is contorted. EXAM: PORTABLE CHEST 1 VIEW COMPARISON:  Chest x-ray dated 11/27/2015. FINDINGS: Right-sided PICC line appears well positioned with tip at the level of the mid SVC. Lungs are clear. No pleural effusion or pneumothorax seen. Cardiomediastinal silhouette is stable. IMPRESSION: Presumed right-sided PICC line appears adequately positioned with tip at the level of the mid SVC. Electronically Signed   By: Bary Richard M.D.   On: 11/29/2015 19:10    Medications:  I have reviewed the patient's current medications. Scheduled: . antiseptic oral rinse  7 mL Mouth Rinse q12n4p  . chlorhexidine  15 mL Mouth Rinse BID  . collagenase   Topical Daily  . famotidine  20 mg Oral BID  . feeding supplement (PRO-STAT SUGAR FREE 64)  30 mL Per Tube Daily  . gabapentin  600 mg Per Tube TID  . levofloxacin (LEVAQUIN) IV  750 mg Intravenous Q24H  . metoprolol tartrate  12.5 mg Oral Q6H  . polyvinyl alcohol  1 drop Both Eyes BID  . potassium chloride  20 mEq Oral Daily  . sodium chloride flush  10-40 mL Intracatheter Q12H  . sodium chloride flush  3 mL Intravenous Q12H  . vancomycin  1,000 mg Intravenous Q8H   Continuous: . feeding supplement (JEVITY 1.5 CAL/FIBER) 1,000 mL (11/29/15 1108)  . norepinephrine      Assessment/Plan:   56 year old female with advanced MS and  contractures  who presented with sepsis. 1. Sepsis: Secondary to osteomyelitis and probable septic arthritis from sacral decubitis. 2. MRSA Sacral decubitus, stage III and 4: Continue vancomycin and Levaquin.  Wound cultures positive for MRSA.  Appreciate surgical and orthopedic evaluation. Patient very poor candidate for surgery and high risk. Continue IV antibiotics. Patient will need PICC line and IV antibiotics for 6 weeks if family interested in continuing treatment.  3. Hyponatremia, resolved with IV fluid administration 4. Severe malnutrition with albumin level of 1.8 with rehydration: continue tube feeds  5. Hypotension, resolved with IVF. 6. Sinus tachycardia: Due to sepsis. Continue metoprolol.   7. Acute posthemorrhagic anemia: Patient is status post PRBC. Hemoglobin up to 8 this morning. Continue to monitor.   8. Right hip septic arthritis: Orthopedic surgery is not recommending surgery due to high risk. Continue IV into buttocks.   9. UTI:  MULTIPLE SPECIES PRESENT, SUGGEST RECOLLECTION .   Overall poor prognosis D.w palliative care   Time= 25 min   LOS: 4 days   Lille Karim 12/01/2015, 1:02 PM

## 2015-12-01 NOTE — Care Management Important Message (Signed)
Important Message  Patient Details  Name: Brittany Madden MRN: [id number] Date of Birth: 1961-01-16   Medicare Important Message Given:  Yes    Collie Siad, RN 12/01/2015, 8:24 AM

## 2015-12-01 NOTE — Progress Notes (Signed)
Per previous CSW note patient is a long term care resident at [facility]. Clinical Child psychotherapist (CSW) left Designer, fashion/clothing at [employer]. CSW will continue to follow and assist as needed.   Jetta Lout, LCSW [phone]

## 2015-12-02 ENCOUNTER — Inpatient Hospital Stay: Payer: Medicare Other

## 2015-12-02 LAB — CULTURE, BLOOD (ROUTINE X 2)
Culture: NO GROWTH
Culture: NO GROWTH

## 2015-12-02 LAB — AEROBIC/ANAEROBIC CULTURE W GRAM STAIN (SURGICAL/DEEP WOUND)

## 2015-12-02 LAB — BASIC METABOLIC PANEL
ANION GAP: 4 — AB (ref 5–15)
BUN: 8 mg/dL (ref 6–20)
CALCIUM: 8 mg/dL — AB (ref 8.9–10.3)
CO2: 28 mmol/L (ref 22–32)
Chloride: 103 mmol/L (ref 101–111)
Creatinine, Ser: 0.38 mg/dL — ABNORMAL LOW (ref 0.44–1.00)
GFR calc non Af Amer: >60 mL/min (ref >60)
Glucose, Bld: 145 mg/dL — ABNORMAL HIGH (ref 65–99)
Potassium: 3.8 mmol/L (ref 3.5–5.1)
Sodium: 135 mmol/L (ref 135–145)

## 2015-12-02 LAB — CBC
HEMATOCRIT: 26.8 % — AB (ref 35.0–47.0)
Hemoglobin: 9.1 g/dL — ABNORMAL LOW (ref 12.0–16.0)
MCH: 27.6 pg (ref 26.0–34.0)
MCHC: 33.8 g/dL (ref 32.0–36.0)
MCV: 81.6 fL (ref 80.0–100.0)
PLATELETS: 417 10*3/uL (ref 150–440)
RBC: 3.29 MIL/uL — ABNORMAL LOW (ref 3.80–5.20)
RDW: 16.4 % — AB (ref 11.5–14.5)
WBC: 11.3 10*3/uL — AB (ref 3.6–11.0)

## 2015-12-02 LAB — GLUCOSE, CAPILLARY
GLUCOSE-CAPILLARY: 117 mg/dL — AB (ref 65–99)
Glucose-Capillary: 113 mg/dL — ABNORMAL HIGH (ref 65–99)
Glucose-Capillary: 114 mg/dL — ABNORMAL HIGH (ref 65–99)

## 2015-12-02 MED ORDER — COLLAGENASE 250 UNIT/GM EX OINT: TOPICAL_OINTMENT | Freq: Every day | CUTANEOUS | Status: AC

## 2015-12-02 NOTE — Discharge Summary (Signed)
[facility] [place] [facility]   PATIENT NAME: Brittany Madden    MR#:  [id number]  DATE OF BIRTH:  [birth year]  DATE OF ADMISSION:  11/27/2015 ADMITTING PHYSICIAN: Katharina Caper, MD  DATE OF DISCHARGE: 12/02/2015 PRIMARY CARE PHYSICIAN: Lorrin Mais, MD    ADMISSION DIAGNOSIS:  Wound infection (HCC) [T14.8, L08.9] Decubitus ulcer of buttock, right, stage IV (HCC) [L89.314] Sepsis, due to unspecified organism (HCC) [A41.9]  DISCHARGE DIAGNOSIS:  Principal Problem:   Sepsis (HCC) Active Problems:   Decubitus ulcer of sacral region, stage 3 (HCC)   Sepsis with multiple organ dysfunction (MOD) (HCC)   Neurogenic bladder   Decubitus ulcer of buttock   Urinary tract infectious disease   SECONDARY DIAGNOSIS:   Past Medical History  Diagnosis Date  . MS (multiple sclerosis) (HCC)   . Depression   . Functional quadriplegia (HCC)   . Neurogenic bladder   . PE (pulmonary embolism) 2012  . DVT (deep venous thrombosis) (HCC) 2012    HOSPITAL COURSE:   55 year old female with advanced MS and contractures who presented with sepsis. For further details please refer to H&P.  1. MRSA sepsis: This is secondary to large right decubitus ulcer and osteomyelitis with cellulitis as well as septic arthritis. Patient was placed on broad-spectrum antibiotic. Cultures were positive for MRSA. I spoke with the patient's brother who is also power of attorney. After long discussion with him and patient's overall very poor prognosis, it was decided that patient should be more comfort care/palliative care/hospice. She will be discharged to skilled nursing facility with hospice and palliative care consult. He did not want antibiotics but she will continue tube feeds until evaluated by palliative care.  2. MRSA sacral decubitus. 3. Hyponatremia  4. Severe malnutrition: 5. Hypertension    DISCHARGE CONDITIONS AND DIET:   Patient in guarded condition discharge skilled  nursing facility with hospice and palliative care continue tube feeds  CONSULTS OBTAINED:  Treatment Team:  Deeann Saint, MD  DRUG ALLERGIES:   Allergies  Allergen Reactions  . Asa [Aspirin] Other (See Comments)    Reaction: unknown  . Keflex [Cephalexin] Other (See Comments)    Reaction: unknown   . Nsaids Other (See Comments)    Reaction: unknown   . Other Other (See Comments)    SEAFOOD  . Salicylates Other (See Comments)    Reaction: unknown    DISCHARGE MEDICATIONS:   Current Discharge Medication List    START taking these medications   Details  collagenase (SANTYL) ointment Apply topically daily. Qty: 15 g, Refills: 0      CONTINUE these medications which have NOT CHANGED   Details  gabapentin (NEURONTIN) 250 MG/5ML solution Place 600 mg into feeding tube 3 (three) times daily.    Nutritional Supplements (TWOCAL HN) LIQD Take 237 mLs by mouth daily.      STOP taking these medications     antiseptic oral rinse (BIOTENE) LIQD      baclofen (LIORESAL) 20 MG tablet      carboxymethylcellulose (REFRESH TEARS) 0.5 % SOLN      clopidogrel (PLAVIX) 75 MG tablet      coal tar (NEUTROGENA T-GEL) 0.5 % shampoo      Cranberry-Vitamin C-Inulin (UTI-STAT PO)      ergocalciferol (DRISDOL) 8000 UNIT/ML drops      escitalopram (LEXAPRO) 5 MG/5ML solution      metoprolol tartrate (LOPRESSOR) 25 MG tablet      mineral oil-hydrophilic petrolatum (AQUAPHOR) ointment  potassium chloride SA (K-DUR,KLOR-CON) 20 MEQ tablet      ranitidine (ZANTAC) 75 MG tablet               Today   CHIEF COMPLAINT:   Patient with fever overnight   VITAL SIGNS:  Blood pressure 92/63, pulse 116, temperature 99 F (37.2 C), temperature source Oral, resp. rate 18, height 5\' 4"  (1.626 m), weight 53.978 kg (119 lb), SpO2 97 %.   REVIEW OF SYSTEMS:  Review of Systems  Unable to perform ROS: critical illness     PHYSICAL EXAMINATION:  GENERAL:  55  y.o.-year-old patient lying in the bed Critically ill-appearing  NECK:  Supple, no tenderness LUNGS: Normal breath sounds bilaterally, no wheezing, rales,rhonchi  No use of accessory muscles of respiration.  CARDIOVASCULAR: S1, S2 normal. No murmurs, rubs, or gallops.  ABDOMEN: Soft, non-tender, non-distended. Bowel sounds present. No organomegaly or mass.  EXTREMITIES: Severe contractures.  PSYCHIATRIC: The patient is alert but nonverbal  SKIN: Stage IV decubitus ulcer right hip with discharge DATA REVIEW:   CBC  Recent Labs Lab 12/02/15 0622  WBC 11.3*  HGB 9.1*  HCT 26.8*  PLT 417    Chemistries   Recent Labs Lab 11/27/15 1857  12/01/15 0450 12/02/15 0622  NA 137  < > 136 135  K 4.6  < > 3.1* 3.8  CL 110  < > 102 103  CO2 22  < > 28 28  GLUCOSE 79  < > 101* 145*  BUN 5*  < > 8 8  CREATININE <0.30*  < > <0.30* 0.38*  CALCIUM 7.9*  < > 7.8* 8.0*  MG  --   --  2.1  --   AST 40  --   --   --   ALT 49  --   --   --   ALKPHOS 136*  --   --   --   BILITOT 1.1  --   --   --   < > = values in this interval not displayed.  Cardiac Enzymes  Recent Labs Lab 11/27/15 0500  TROPONINI <0.03    Microbiology Results  @[email or web address] @  RADIOLOGY:  No results found.    Management plans discussed with the patient's brother, POAand  he is in agreement. Stable for discharge SNF with hospice and palliative care consult  Patient should follow up with hospice and palliative care  CODE STATUS:     Code Status Orders        Start     Ordered   11/27/15 1131  Do not attempt resuscitation (DNR)   Continuous    Question Answer Comment  In the event of cardiac or respiratory ARREST Do not call a "code blue"   In the event of cardiac or respiratory ARREST Do not perform Intubation, CPR, defibrillation or ACLS   In the event of cardiac or respiratory ARREST Use medication by any route, position, wound care, and other measures to relive pain and suffering. May use oxygen,  suction and manual treatment of airway obstruction as needed for comfort.      11/27/15 1130    Code Status History    Date Active Date Inactive Code Status Order ID Comments User Context   11/27/2015  5:20 AM 11/27/2015 11:30 AM DNR [id number]  Loleta Rose, MD ED   01/03/2015  5:49 AM 01/09/2015  4:31 PM DNR [id number]  Elenora Fender Ward, MD ED    Advance Directive Documentation  Most Recent Value   Type of Advance Directive  Out of facility DNR (pink MOST or yellow form)   Pre-existing out of facility DNR order (yellow form or pink MOST form)     "MOST" Form in Place?        TOTAL TIME TAKING CARE OF THIS PATIENT: 45 minutes.    Note: This dictation was prepared with Dragon dictation along with smaller phrase technology. Any transcriptional errors that result from this process are unintentional.  Adyen Bifulco M.D on 12/02/2015 at 11:22 AM  Between 7am to 6pm - Pager - [phone] After 6pm go to [email or web address] - Social research officer, government  [facility]  Office  [phone]  CC: Primary care physician; Lorrin Mais, MD

## 2015-12-02 NOTE — Progress Notes (Signed)
Referral for Palliative Care to follow at [facility] following discharge received from CSW [facility]. Information faxed to [facility] [place] referral. Thank you. Dayna Barker RN, BSN, [facility] [place], [facility] [phone]

## 2015-12-02 NOTE — Progress Notes (Signed)
Subjective: Fever of 100.4 last night  Objective: Vital signs in last 24 hours: Temp:  [99 F (37.2 C)-100.4 F (38 C)] 99 F (37.2 C) (06/29 0841) Pulse Rate:  [116-123] 116 (06/29 0841) Resp:  [18] 18 (06/29 0841) BP: (92-125)/(63-74) 92/63 mmHg (06/29 0841) SpO2:  [97 %] 97 % (06/29 0841) Weight:  [53.978 kg (119 lb)] 53.978 kg (119 lb) (06/29 0300) Weight change: 0 kg (0 lb) Last BM Date: 11/30/15  Intake/Output from previous day: 06/28 0701 - 06/29 0700 In: 5974 [NG/GT:1713] Out: 2675 [Urine:2675] Intake/Output this shift:    General appearance: Chronically ill Head: Normocephalic, without obvious abnormality, atraumatic Eyes: conjunctivae/corneas clear.  Resp: clear to auscultation bilaterally and normal percussion bilaterally Cardio: regular rate and rhythm, S1, S2 normal, no murmur, click, rub or gallop GI: soft, non-tender; bowel sounds normal; no masses, no organomegaly Extremities: Ulcers and una boots on both feet. Large stage 4 sacral decubitis ulcer with drainage. Neurologic:  contracted   Lab Results:  Recent Labs  11/30/15 0507 12/02/15 0622  WBC 14.8* 11.3*  HGB 10.0* 9.1*  HCT 29.1* 26.8*  PLT 390 417   BMET  Recent Labs  12/01/15 0450 12/02/15 0622  NA 136 135  K 3.1* 3.8  CL 102 103  CO2 28 28  GLUCOSE 101* 145*  BUN 8 8  CREATININE <0.30* 0.38*  CALCIUM 7.8* 8.0*    Studies/Results: No results found.  Medications:  I have reviewed the patient's current medications. Scheduled: . antiseptic oral rinse  7 mL Mouth Rinse q12n4p  . chlorhexidine  15 mL Mouth Rinse BID  . collagenase   Topical Daily  . famotidine  20 mg Oral BID  . feeding supplement (PRO-STAT SUGAR FREE 64)  30 mL Per Tube Daily  . gabapentin  600 mg Per Tube TID  . levofloxacin (LEVAQUIN) IV  750 mg Intravenous Q24H  . metoprolol tartrate  12.5 mg Oral Q6H  . polyvinyl alcohol  1 drop Both Eyes BID  . sodium chloride flush  10-40 mL Intracatheter Q12H  .  sodium chloride flush  3 mL Intravenous Q12H  . vancomycin  1,000 mg Intravenous Q8H   Continuous: . feeding supplement (JEVITY 1.5 CAL/FIBER) 1,000 mL (11/29/15 1108)    Assessment/Plan:   55 year old female with advanced MS and contractures  who presented with sepsis.  1. Sepsis: Secondary to osteomyelitis/cellulitisi wuith large decubitus ulcer right hip and probable septic arthritis from sacral decubitis. 2. MRSA Sacral decubitus, stage III and 4: Continue vancomycin. Will d/c Levaquin Wound cultures positive for MRSA.  Appreciate surgical and orthopedic evaluation. Patient very poor candidate for surgery and high risk. Patient will need PICC line and IV antibiotics for 2 weeks then change to PO bactrim per sensitivities for 4 more weeks   3. Hyponatremia, resolved with IV fluid administration 4. Severe malnutrition with albumin level of 1.8 with rehydration: continue tube feeds  5. Hypotension, resolved with IVF. 6. Sinus tachycardia: Due to sepsis. Continue metoprolol.   7. Acute posthemorrhagic anemia: Patient is status post PRBC. Hemoglobinstable. Continue to monitor.   8. Right hip septic arthritis: Orthopedic surgery is not recommending surgery due to high risk. Continue IV antibiotics  9. UTI:  MULTIPLE SPECIES PRESENT, SUGGEST RECOLLECTION .  10. UTI: Check CXR and UA Suspect could be from septic joint.   Overall very poor prognosis D.w palliative care will consult palliative care at SNF  D/c once fever subsides Time= 25 min   LOS: 5 days   Brittany Madden,  Brittany Madden 12/02/2015, 10:54 AM

## 2015-12-02 NOTE — Progress Notes (Signed)
Patient is medically stable for D/C back to [facility] with palliative following. Per Gavin Pound admissions coordinator at [facility] patient will go to room 103-B. RN will call report to [facility]. [facility] liaison is aware of above. Clinical Child psychotherapist (CSW) sent D/C Summary, FL2 and D/C Packet to [employer] via [employer]. CSW contacted patient's brother Jorja Loa and made him aware of above. Please reconsult if future social work needs arise. CSW signing off.   Jetta Lout, LCSW [phone]

## 2016-01-04 DEATH — deceased

## 2017-04-07 IMAGING — DX DG CHEST 1V
1 series · 1 of 1 positions shown · non-contrast
Comparison: 11/28/2015

CLINICAL DATA: Fever.

EXAM:
CHEST 1 VIEW

[chest ap]
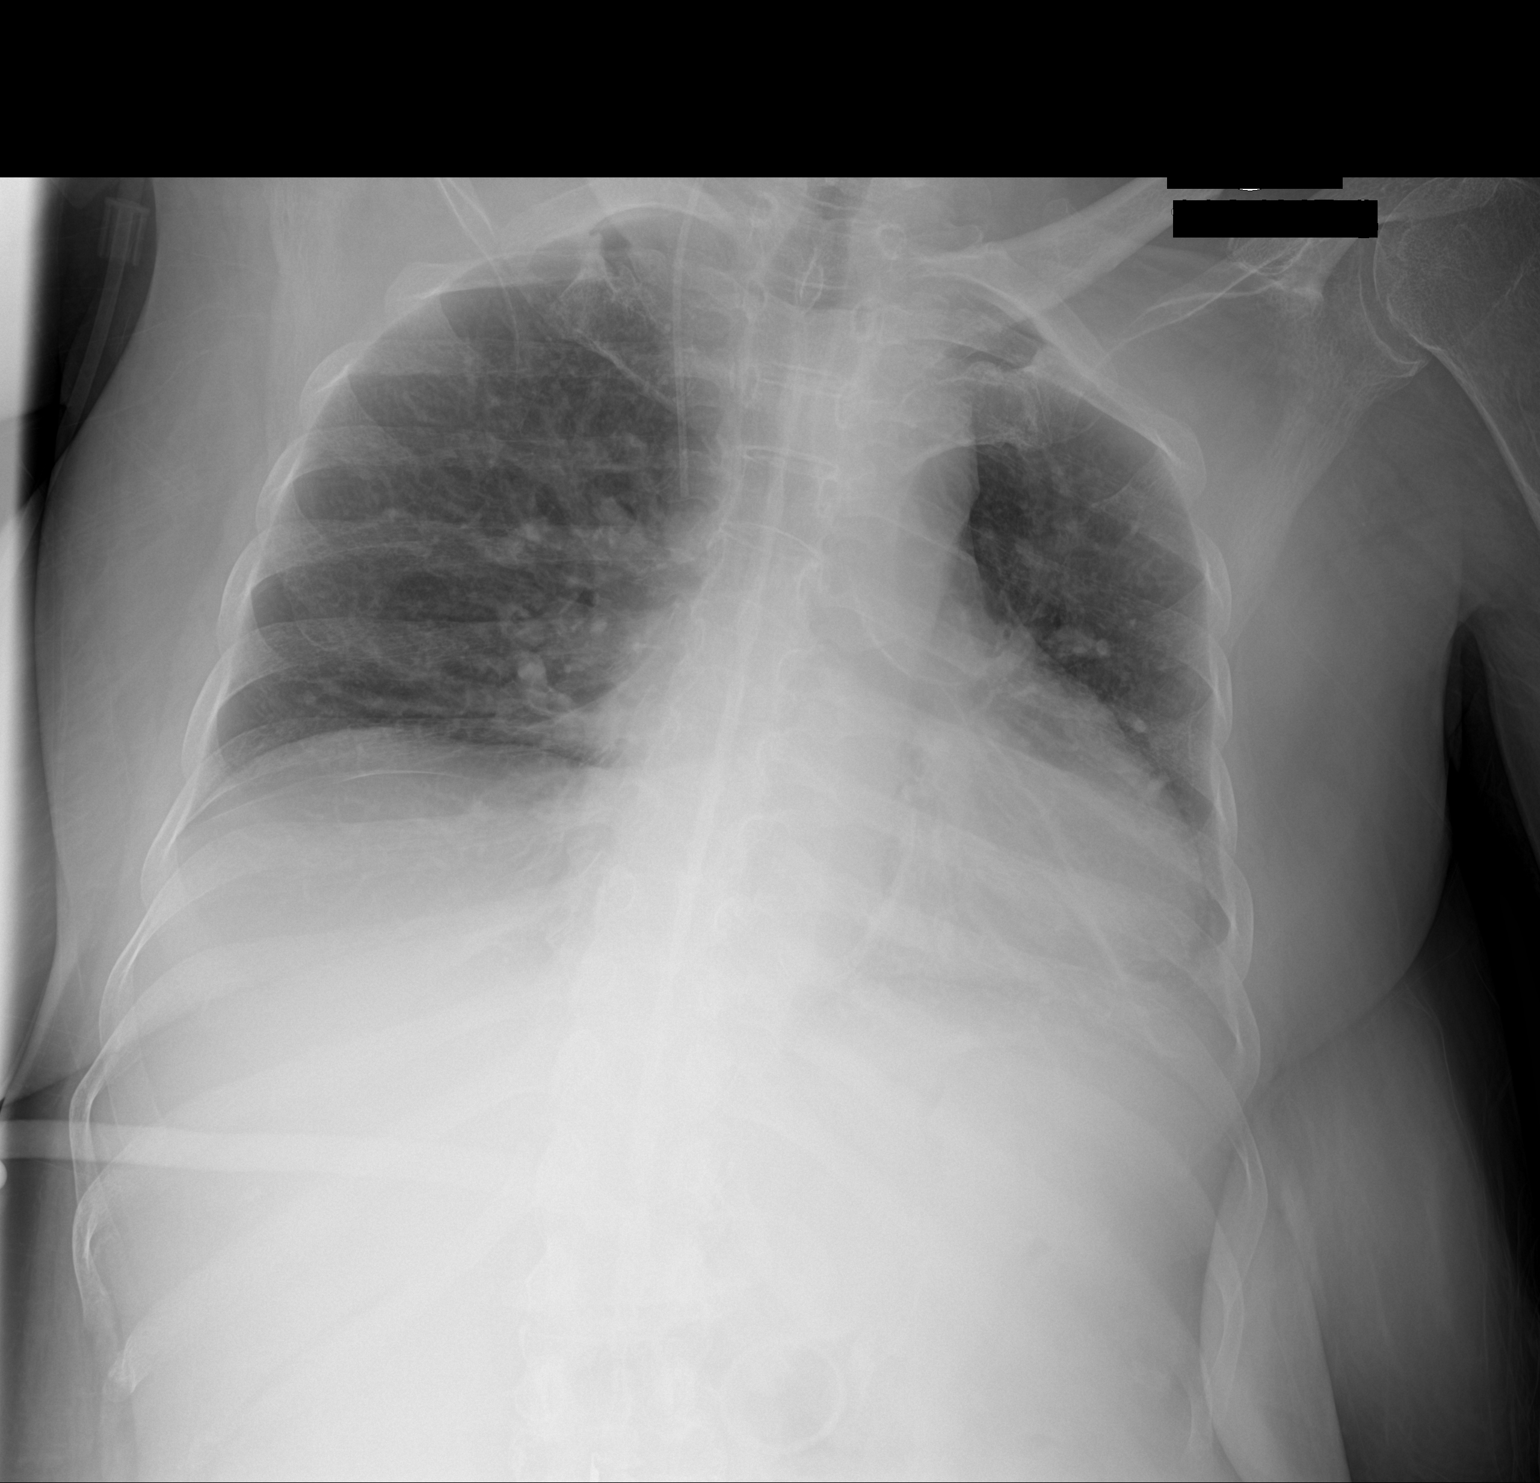

[1 of 1 positions shown; findings below may reference images not displayed]

FINDINGS: Right internal jugular approach PICC line terminates within the
expected location of the mid superior vena cava.

The cardiac silhouette is mildly enlarged. Mediastinal contours
appear intact.

There is no evidence of pneumothorax. Low lung volumes with
peribronchovascular airspace consolidation in the left lower lobe.

Osseous structures are without acute abnormality. Soft tissues are
grossly normal.
IMPRESSION: Development of airspace consolidation in the left lower lobe, which
may represent bronchopneumonia.

Low lung volumes.
